# Patient Record
Sex: Female | Born: 1940 | Race: White | Hispanic: No | Smoking: Never smoker
Health system: Southern US, Community
[De-identification: ages and names within clinical notes are randomized; demographics above are authoritative.]

## PROBLEM LIST (undated history)

## (undated) DIAGNOSIS — E7800 Pure hypercholesterolemia, unspecified: Secondary | ICD-10-CM

## (undated) DIAGNOSIS — E2839 Other primary ovarian failure: Secondary | ICD-10-CM

## (undated) DIAGNOSIS — M8580 Other specified disorders of bone density and structure, unspecified site: Secondary | ICD-10-CM

## (undated) DIAGNOSIS — E079 Disorder of thyroid, unspecified: Secondary | ICD-10-CM

## (undated) DIAGNOSIS — I10 Essential (primary) hypertension: Secondary | ICD-10-CM

## (undated) DIAGNOSIS — A4901 Methicillin susceptible Staphylococcus aureus infection, unspecified site: Secondary | ICD-10-CM

---

## 1999-04-07 ENCOUNTER — Encounter: Payer: Self-pay | Admitting: Orthopedic Surgery

## 1999-04-12 ENCOUNTER — Encounter: Payer: Self-pay | Admitting: Orthopedic Surgery

## 1999-04-12 ENCOUNTER — Inpatient Hospital Stay (HOSPITAL_COMMUNITY): Admission: RE | Admit: 1999-04-12 | Discharge: 1999-04-18 | Payer: Self-pay | Admitting: Orthopedic Surgery

## 1999-06-28 ENCOUNTER — Inpatient Hospital Stay (HOSPITAL_COMMUNITY): Admission: RE | Admit: 1999-06-28 | Discharge: 1999-07-05 | Payer: Self-pay | Admitting: Orthopedic Surgery

## 1999-06-28 ENCOUNTER — Encounter: Payer: Self-pay | Admitting: Orthopedic Surgery

## 1999-07-08 ENCOUNTER — Inpatient Hospital Stay (HOSPITAL_COMMUNITY): Admission: EM | Admit: 1999-07-08 | Discharge: 1999-07-16 | Payer: Self-pay | Admitting: Emergency Medicine

## 1999-07-12 ENCOUNTER — Encounter: Payer: Self-pay | Admitting: Orthopedic Surgery

## 1999-07-16 ENCOUNTER — Inpatient Hospital Stay
Admission: RE | Admit: 1999-07-16 | Discharge: 1999-07-28 | Payer: Self-pay | Admitting: Physical Medicine & Rehabilitation

## 2000-12-11 ENCOUNTER — Other Ambulatory Visit: Admission: RE | Admit: 2000-12-11 | Discharge: 2000-12-11 | Payer: Self-pay | Admitting: Family Medicine

## 2001-05-22 ENCOUNTER — Encounter: Payer: Self-pay | Admitting: Orthopedic Surgery

## 2001-05-23 ENCOUNTER — Inpatient Hospital Stay (HOSPITAL_COMMUNITY): Admission: RE | Admit: 2001-05-23 | Discharge: 2001-05-28 | Payer: Self-pay | Admitting: Orthopedic Surgery

## 2001-05-23 ENCOUNTER — Encounter: Payer: Self-pay | Admitting: Orthopedic Surgery

## 2001-05-28 ENCOUNTER — Inpatient Hospital Stay (HOSPITAL_COMMUNITY)
Admission: AD | Admit: 2001-05-28 | Discharge: 2001-06-01 | Payer: Self-pay | Admitting: Physical Medicine & Rehabilitation

## 2002-08-28 ENCOUNTER — Encounter: Payer: Self-pay | Admitting: Orthopedic Surgery

## 2002-09-04 ENCOUNTER — Inpatient Hospital Stay (HOSPITAL_COMMUNITY): Admission: RE | Admit: 2002-09-04 | Discharge: 2002-09-08 | Payer: Self-pay | Admitting: Orthopedic Surgery

## 2002-09-04 ENCOUNTER — Encounter: Payer: Self-pay | Admitting: Orthopedic Surgery

## 2011-03-15 ENCOUNTER — Encounter (HOSPITAL_COMMUNITY): Payer: Self-pay | Admitting: Pharmacy Technician

## 2011-03-16 ENCOUNTER — Other Ambulatory Visit: Payer: Self-pay | Admitting: Orthopedic Surgery

## 2011-03-16 ENCOUNTER — Ambulatory Visit (HOSPITAL_COMMUNITY): Admission: RE | Admit: 2011-03-16 | Payer: Medicare Other | Source: Ambulatory Visit | Admitting: Orthopedic Surgery

## 2011-03-16 ENCOUNTER — Ambulatory Visit
Admission: RE | Admit: 2011-03-16 | Discharge: 2011-03-16 | Disposition: A | Discharge status: Home / Self Care | Payer: Medicare Other | Source: Ambulatory Visit | Attending: Orthopedic Surgery | Admitting: Orthopedic Surgery

## 2011-03-16 ENCOUNTER — Encounter (HOSPITAL_COMMUNITY): Admission: RE | Payer: Self-pay | Source: Ambulatory Visit

## 2011-03-16 DIAGNOSIS — Z96649 Presence of unspecified artificial hip joint: Secondary | ICD-10-CM

## 2011-03-16 LAB — SYNOVIAL CELL COUNT + DIFF, W/ CRYSTALS
Lymphocytes-Synovial Fld: 5 % (ref 0–20)
Neutrophil, Synovial: 95 % — ABNORMAL HIGH (ref 0–25)

## 2011-03-16 SURGERY — IRRIGATION AND DEBRIDEMENT EXTREMITY
Anesthesia: General | Laterality: Right

## 2011-03-17 LAB — C REACTIVE PROTEIN, FLUID: C Reactive Protein, Fluid: 6.4 mg/dL

## 2011-03-19 LAB — BODY FLUID CULTURE: Organism ID, Bacteria: NO GROWTH

## 2016-12-13 ENCOUNTER — Encounter (HOSPITAL_COMMUNITY): Payer: Self-pay | Admitting: Emergency Medicine

## 2016-12-13 ENCOUNTER — Ambulatory Visit (HOSPITAL_COMMUNITY)
Admission: EM | Admit: 2016-12-13 | Discharge: 2016-12-13 | Disposition: A | Discharge status: Home / Self Care | Payer: Medicare Other | Attending: Family Medicine | Admitting: Family Medicine

## 2016-12-13 ENCOUNTER — Other Ambulatory Visit: Payer: Self-pay

## 2016-12-13 DIAGNOSIS — K5900 Constipation, unspecified: Secondary | ICD-10-CM

## 2016-12-13 HISTORY — DX: Disorder of thyroid, unspecified: E07.9

## 2016-12-13 HISTORY — DX: Methicillin susceptible Staphylococcus aureus infection, unspecified site: A49.01

## 2016-12-13 HISTORY — DX: Essential (primary) hypertension: I10

## 2016-12-13 HISTORY — DX: Pure hypercholesterolemia, unspecified: E78.00

## 2016-12-13 HISTORY — DX: Other primary ovarian failure: E28.39

## 2016-12-13 HISTORY — DX: Other specified disorders of bone density and structure, unspecified site: M85.80

## 2016-12-13 MED ORDER — POLYETHYLENE GLYCOL 3350 17 GM/SCOOP PO POWD: 1.0000 | ORAL | 0 refills | Status: AC | PRN

## 2016-12-13 NOTE — ED Provider Notes (Signed)
[facility]   [id number] 12/13/16 Arrival Time: 1103   SUBJECTIVE:  Erica Suarez is a 76 y.o. female who presents to the urgent care with complaint of constipation x2 days with abdominal tenderness and soreness.  She thinks it is from her medicine changes that started two weeks ago.    Pt states she was instructed to start taking her Synthroid by itself, first thing in the morning on an empty stomach.  She started having issues with constipation two nights ago and the pain in her abdomen has only gotten worse.  Pt did call her PCP to discuss her issues.  She is taking Colace as instructed.  Last BM was 4 days ago.     Past Medical History:  Diagnosis Date  . Estrogen deficiency   . Hypercholesterolemia   . Hypertension   . Methicillin susceptible Staphylococcus aureus infection, unspecified site   . Osteopenia   . Thyroid disease    History reviewed. No pertinent family history. Social History   Socioeconomic History  . Marital status: Divorced    Spouse name: Not on file  . Number of children: Not on file  . Years of education: Not on file  . Highest education level: Not on file  Social Needs  . Financial resource strain: Not on file  . Food insecurity - worry: Not on file  . Food insecurity - inability: Not on file  . Transportation needs - medical: Not on file  . Transportation needs - non-medical: Not on file  Occupational History  . Not on file  Tobacco Use  . Smoking status: Never Smoker  . Smokeless tobacco: Never Used  Substance and Sexual Activity  . Alcohol use: No    Frequency: Never  . Drug use: No  . Sexual activity: Not on file  Other Topics Concern  . Not on file  Social History Narrative  . Not on file   Current Meds  Medication Sig  . aspirin EC 81 MG tablet Take 81 mg by mouth daily.  . calcium carbonate (OS-CAL) 600 MG TABS Take 1,200 mg by mouth daily.  . cephALEXin (KEFLEX) 250 MG capsule Take 750 mg by mouth 2 (two)  times daily.  Marland Kitchen. levothyroxine (SYNTHROID, LEVOTHROID) 50 MCG tablet Take 50 mcg by mouth daily.  . Multiple Vitamin (MULITIVITAMIN WITH MINERALS) TABS Take 1 tablet by mouth daily.  . Omega-3 Fatty Acids (FISH OIL) 1200 MG CAPS Take 2,400 mg by mouth daily.  . pravastatin (PRAVACHOL) 40 MG tablet Take 40 mg by mouth daily.  . raloxifene (EVISTA) 60 MG tablet Take 60 mg by mouth daily.  . valsartan-hydrochlorothiazide (DIOVAN-HCT) 160-25 MG per tablet Take 1 tablet by mouth daily.  . vitamin E 400 UNIT capsule Take 800 Units by mouth daily.   Allergies  Allergen Reactions  . Morphine And Related       ROS: As per HPI, remainder of ROS negative.   OBJECTIVE:   Vitals:   12/13/16 1124  BP: 105/68  Pulse: 89  Temp: 97.6 F (36.4 C)  TempSrc: Oral  SpO2: 95%     General appearance: alert; no distress Eyes: PERRL; EOMI; conjunctiva normal HENT: normocephalic; atraumatic; external ears normal without trauma; nasal mucosa normal; oral mucosa normal Neck: supple Abdomen: soft, distended and mild diffuse tenderness; bowel sounds normal; no masses or organomegaly; no guarding or rebound tenderness Back: no CVA tenderness Extremities: no cyanosis or edema; symmetrical with no gross deformities Skin: warm and dry Neurologic: normal gait;  grossly normal Psychological: alert and cooperative; normal mood and affect      Labs:  Results for orders placed or performed in visit on 03/16/11  Body fluid culture  Result Value Ref Range   Gram Stain Moderate    Gram Stain WBC present-predominately PMN    Gram Stain No Organisms Seen    Gram Stain Gram Stain Report Called to,Read Back By     Gram Stain and Verified With:    Gram Stain DR. LANDAU 03/16/11 15:05 BY GARRS    Organism ID, Bacteria NO GROWTH 3 DAYS     Labs Reviewed - No data to display  No results found.     ASSESSMENT & PLAN:  1. Constipation, unspecified constipation type     Meds ordered this encounter    Medications  . polyethylene glycol powder (MIRALAX) powder    Sig: Take 255 g by mouth every hour as needed.    Dispense:  255 g    Refill:  0    Reviewed expectations re: course of current medical issues. Questions answered. Outlined signs and symptoms indicating need for more acute intervention. Patient verbalized understanding. After Visit Summary given.    Procedures:      Elvina SidleLauenstein, Chayna Surratt, MD 12/13/16 1139

## 2016-12-13 NOTE — ED Triage Notes (Signed)
Pt reports constipation x2 days with abdominal tenderness and soreness.  She thinks it is from her medicine changes that started two weeks ago.  Pt states she was instructed to start taking her Synthroid by itself, first thing in the morning on an empty stomach.  She started having issues with constipation two nights ago and the pain in her abdomen has only gotten worse.  Pt did not call her PCP to discuss her issues.

## 2016-12-13 NOTE — Discharge Instructions (Signed)
Use glycerin suppository daily  Take the Miralax powder, 1 capful in 8 oz water, every hour until you have a BM.  Then take one glassful daily for a week.  Prune juice and other fruits help with regularity, along with plenty of liquids.

## 2016-12-14 ENCOUNTER — Emergency Department (HOSPITAL_COMMUNITY): Payer: Medicare Other

## 2016-12-14 ENCOUNTER — Inpatient Hospital Stay (HOSPITAL_COMMUNITY)
Admission: EM | Admit: 2016-12-14 | Discharge: 2017-01-10 | DRG: 329 | Disposition: E | Payer: Medicare Other | Attending: Pulmonary Disease | Admitting: Pulmonary Disease

## 2016-12-14 ENCOUNTER — Encounter (HOSPITAL_COMMUNITY): Admission: EM | Disposition: E | Payer: Self-pay | Source: Home / Self Care | Attending: Pulmonary Disease

## 2016-12-14 ENCOUNTER — Other Ambulatory Visit: Payer: Self-pay

## 2016-12-14 ENCOUNTER — Encounter (HOSPITAL_COMMUNITY): Payer: Self-pay | Admitting: Emergency Medicine

## 2016-12-14 DIAGNOSIS — I469 Cardiac arrest, cause unspecified: Secondary | ICD-10-CM | POA: Diagnosis not present

## 2016-12-14 DIAGNOSIS — E871 Hypo-osmolality and hyponatremia: Secondary | ICD-10-CM | POA: Diagnosis present

## 2016-12-14 DIAGNOSIS — G9340 Encephalopathy, unspecified: Secondary | ICD-10-CM | POA: Diagnosis not present

## 2016-12-14 DIAGNOSIS — E860 Dehydration: Secondary | ICD-10-CM | POA: Diagnosis present

## 2016-12-14 DIAGNOSIS — E874 Mixed disorder of acid-base balance: Secondary | ICD-10-CM | POA: Diagnosis not present

## 2016-12-14 DIAGNOSIS — M8580 Other specified disorders of bone density and structure, unspecified site: Secondary | ICD-10-CM | POA: Diagnosis present

## 2016-12-14 DIAGNOSIS — Z8051 Family history of malignant neoplasm of kidney: Secondary | ICD-10-CM

## 2016-12-14 DIAGNOSIS — I708 Atherosclerosis of other arteries: Secondary | ICD-10-CM | POA: Diagnosis present

## 2016-12-14 DIAGNOSIS — Z66 Do not resuscitate: Secondary | ICD-10-CM | POA: Diagnosis not present

## 2016-12-14 DIAGNOSIS — Z91048 Other nonmedicinal substance allergy status: Secondary | ICD-10-CM

## 2016-12-14 DIAGNOSIS — G9341 Metabolic encephalopathy: Secondary | ICD-10-CM | POA: Diagnosis not present

## 2016-12-14 DIAGNOSIS — D649 Anemia, unspecified: Secondary | ICD-10-CM | POA: Diagnosis present

## 2016-12-14 DIAGNOSIS — N19 Unspecified kidney failure: Secondary | ICD-10-CM

## 2016-12-14 DIAGNOSIS — Z808 Family history of malignant neoplasm of other organs or systems: Secondary | ICD-10-CM

## 2016-12-14 DIAGNOSIS — N179 Acute kidney failure, unspecified: Secondary | ICD-10-CM | POA: Diagnosis present

## 2016-12-14 DIAGNOSIS — Z885 Allergy status to narcotic agent status: Secondary | ICD-10-CM

## 2016-12-14 DIAGNOSIS — J80 Acute respiratory distress syndrome: Secondary | ICD-10-CM | POA: Diagnosis not present

## 2016-12-14 DIAGNOSIS — R109 Unspecified abdominal pain: Secondary | ICD-10-CM | POA: Diagnosis present

## 2016-12-14 DIAGNOSIS — Z7982 Long term (current) use of aspirin: Secondary | ICD-10-CM

## 2016-12-14 DIAGNOSIS — K449 Diaphragmatic hernia without obstruction or gangrene: Secondary | ICD-10-CM | POA: Diagnosis present

## 2016-12-14 DIAGNOSIS — A419 Sepsis, unspecified organism: Secondary | ICD-10-CM | POA: Diagnosis not present

## 2016-12-14 DIAGNOSIS — I2510 Atherosclerotic heart disease of native coronary artery without angina pectoris: Secondary | ICD-10-CM | POA: Diagnosis present

## 2016-12-14 DIAGNOSIS — R579 Shock, unspecified: Secondary | ICD-10-CM | POA: Diagnosis not present

## 2016-12-14 DIAGNOSIS — I10 Essential (primary) hypertension: Secondary | ICD-10-CM | POA: Diagnosis present

## 2016-12-14 DIAGNOSIS — E876 Hypokalemia: Secondary | ICD-10-CM | POA: Diagnosis not present

## 2016-12-14 DIAGNOSIS — K562 Volvulus: Secondary | ICD-10-CM | POA: Diagnosis present

## 2016-12-14 DIAGNOSIS — K658 Other peritonitis: Secondary | ICD-10-CM | POA: Diagnosis not present

## 2016-12-14 DIAGNOSIS — Z452 Encounter for adjustment and management of vascular access device: Secondary | ICD-10-CM

## 2016-12-14 DIAGNOSIS — Z79890 Hormone replacement therapy (postmenopausal): Secondary | ICD-10-CM | POA: Diagnosis not present

## 2016-12-14 DIAGNOSIS — Z800 Family history of malignant neoplasm of digestive organs: Secondary | ICD-10-CM

## 2016-12-14 DIAGNOSIS — Z515 Encounter for palliative care: Secondary | ICD-10-CM | POA: Diagnosis not present

## 2016-12-14 DIAGNOSIS — E785 Hyperlipidemia, unspecified: Secondary | ICD-10-CM | POA: Diagnosis present

## 2016-12-14 DIAGNOSIS — E039 Hypothyroidism, unspecified: Secondary | ICD-10-CM | POA: Diagnosis present

## 2016-12-14 DIAGNOSIS — N289 Disorder of kidney and ureter, unspecified: Secondary | ICD-10-CM

## 2016-12-14 DIAGNOSIS — J9601 Acute respiratory failure with hypoxia: Secondary | ICD-10-CM

## 2016-12-14 DIAGNOSIS — K631 Perforation of intestine (nontraumatic): Secondary | ICD-10-CM | POA: Diagnosis not present

## 2016-12-14 DIAGNOSIS — T17908A Unspecified foreign body in respiratory tract, part unspecified causing other injury, initial encounter: Secondary | ICD-10-CM

## 2016-12-14 DIAGNOSIS — D72829 Elevated white blood cell count, unspecified: Secondary | ICD-10-CM | POA: Diagnosis not present

## 2016-12-14 DIAGNOSIS — Z9289 Personal history of other medical treatment: Secondary | ICD-10-CM

## 2016-12-14 DIAGNOSIS — E7800 Pure hypercholesterolemia, unspecified: Secondary | ICD-10-CM | POA: Diagnosis present

## 2016-12-14 DIAGNOSIS — R6521 Severe sepsis with septic shock: Secondary | ICD-10-CM | POA: Diagnosis not present

## 2016-12-14 HISTORY — PX: FLEXIBLE SIGMOIDOSCOPY: SHX5431

## 2016-12-14 LAB — LIPASE, BLOOD: LIPASE: 39 U/L (ref 11–51)

## 2016-12-14 LAB — COMPREHENSIVE METABOLIC PANEL
ALT: 30 U/L (ref 14–54)
AST: 61 U/L — ABNORMAL HIGH (ref 15–41)
Albumin: 3.4 g/dL — ABNORMAL LOW (ref 3.5–5.0)
Alkaline Phosphatase: 106 U/L (ref 38–126)
Anion gap: 19 — ABNORMAL HIGH (ref 5–15)
BUN: 56 mg/dL — ABNORMAL HIGH (ref 6–20)
CHLORIDE: 88 mmol/L — AB (ref 101–111)
CO2: 20 mmol/L — ABNORMAL LOW (ref 22–32)
CREATININE: 2.34 mg/dL — AB (ref 0.44–1.00)
Calcium: 11.2 mg/dL — ABNORMAL HIGH (ref 8.9–10.3)
GFR, EST AFRICAN AMERICAN: 22 mL/min — AB (ref >60)
GFR, EST NON AFRICAN AMERICAN: 19 mL/min — AB (ref >60)
Glucose, Bld: 139 mg/dL — ABNORMAL HIGH (ref 65–99)
POTASSIUM: 2.4 mmol/L — AB (ref 3.5–5.1)
Sodium: 127 mmol/L — ABNORMAL LOW (ref 135–145)
Total Bilirubin: 0.7 mg/dL (ref 0.3–1.2)
Total Protein: 7.4 g/dL (ref 6.5–8.1)

## 2016-12-14 LAB — I-STAT CG4 LACTIC ACID, ED: Lactic Acid, Venous: 2.16 mmol/L (ref 0.5–1.9)

## 2016-12-14 LAB — CBC
HEMATOCRIT: 36.1 % (ref 36.0–46.0)
Hemoglobin: 12 g/dL (ref 12.0–15.0)
MCH: 24.3 pg — ABNORMAL LOW (ref 26.0–34.0)
MCHC: 33.2 g/dL (ref 30.0–36.0)
MCV: 73.2 fL — AB (ref 78.0–100.0)
PLATELETS: 403 10*3/uL — AB (ref 150–400)
RBC: 4.93 MIL/uL (ref 3.87–5.11)
RDW: 16.6 % — ABNORMAL HIGH (ref 11.5–15.5)
WBC: 16.8 10*3/uL — AB (ref 4.0–10.5)

## 2016-12-14 LAB — POC OCCULT BLOOD, ED: FECAL OCCULT BLD: NEGATIVE

## 2016-12-14 LAB — CREATININE, SERUM
Creatinine, Ser: 2.48 mg/dL — ABNORMAL HIGH (ref 0.44–1.00)
GFR calc non Af Amer: 18 mL/min — ABNORMAL LOW (ref >60)
GFR, EST AFRICAN AMERICAN: 20 mL/min — AB (ref >60)

## 2016-12-14 LAB — TSH: TSH: 1.937 u[IU]/mL (ref 0.350–4.500)

## 2016-12-14 LAB — OSMOLALITY: OSMOLALITY: 286 mosm/kg (ref 275–295)

## 2016-12-14 SURGERY — SIGMOIDOSCOPY, FLEXIBLE
Anesthesia: Moderate Sedation

## 2016-12-14 MED ORDER — ACETAMINOPHEN 325 MG PO TABS: 650.0000 mg | ORAL_TABLET | Freq: Four times a day (QID) | ORAL | Status: DC | PRN

## 2016-12-14 MED ORDER — ONDANSETRON HCL 4 MG/2ML IJ SOLN
4.0000 mg | Freq: Once | INTRAMUSCULAR | Status: AC
  Administered 2016-12-14: 4 mg via INTRAVENOUS
  Filled 2016-12-14: qty 2

## 2016-12-14 MED ORDER — POTASSIUM CHLORIDE CRYS ER 20 MEQ PO TBCR
40.0000 meq | EXTENDED_RELEASE_TABLET | Freq: Once | ORAL | Status: DC
  Filled 2016-12-14: qty 2

## 2016-12-14 MED ORDER — RALOXIFENE HCL 60 MG PO TABS
60.0000 mg | ORAL_TABLET | Freq: Every day | ORAL | Status: DC
  Administered 2016-12-15: 60 mg via ORAL
  Filled 2016-12-14 (×2): qty 1

## 2016-12-14 MED ORDER — SODIUM CHLORIDE 0.9 % IV BOLUS (SEPSIS)
1000.0000 mL | Freq: Once | INTRAVENOUS | Status: AC
  Administered 2016-12-14: 1000 mL via INTRAVENOUS

## 2016-12-14 MED ORDER — ASPIRIN EC 81 MG PO TBEC
81.0000 mg | DELAYED_RELEASE_TABLET | Freq: Every day | ORAL | Status: DC
  Administered 2016-12-15: 81 mg via ORAL
  Filled 2016-12-14: qty 1

## 2016-12-14 MED ORDER — CEPHALEXIN 500 MG PO CAPS
750.0000 mg | ORAL_CAPSULE | Freq: Two times a day (BID) | ORAL | Status: DC
  Administered 2016-12-14 – 2016-12-15 (×2): 750 mg via ORAL
  Filled 2016-12-14 (×2): qty 1

## 2016-12-14 MED ORDER — LEVOTHYROXINE SODIUM 50 MCG PO TABS
50.0000 ug | ORAL_TABLET | Freq: Every day | ORAL | Status: DC
  Administered 2016-12-15: 50 ug via ORAL
  Filled 2016-12-14: qty 1

## 2016-12-14 MED ORDER — FENTANYL CITRATE (PF) 100 MCG/2ML IJ SOLN
INTRAMUSCULAR | Status: AC
  Filled 2016-12-14: qty 2

## 2016-12-14 MED ORDER — HEPARIN SODIUM (PORCINE) 5000 UNIT/ML IJ SOLN
5000.0000 [IU] | Freq: Three times a day (TID) | INTRAMUSCULAR | Status: DC
  Administered 2016-12-14 – 2016-12-16 (×4): 5000 [IU] via SUBCUTANEOUS
  Filled 2016-12-14 (×5): qty 1

## 2016-12-14 MED ORDER — DIPHENHYDRAMINE HCL 50 MG/ML IJ SOLN
INTRAMUSCULAR | Status: AC
  Filled 2016-12-14: qty 1

## 2016-12-14 MED ORDER — PRAVASTATIN SODIUM 40 MG PO TABS
40.0000 mg | ORAL_TABLET | Freq: Every day | ORAL | Status: DC
  Administered 2016-12-14: 40 mg via ORAL
  Filled 2016-12-14: qty 1

## 2016-12-14 MED ORDER — FENTANYL CITRATE (PF) 100 MCG/2ML IJ SOLN
50.0000 ug | Freq: Once | INTRAMUSCULAR | Status: AC
  Administered 2016-12-14: 50 ug via INTRAVENOUS
  Filled 2016-12-14: qty 2

## 2016-12-14 MED ORDER — MIDAZOLAM HCL 5 MG/ML IJ SOLN
INTRAMUSCULAR | Status: AC
  Filled 2016-12-14: qty 2

## 2016-12-14 MED ORDER — MIDAZOLAM HCL 10 MG/2ML IJ SOLN
INTRAMUSCULAR | Status: DC | PRN
  Administered 2016-12-14: 1 mg via INTRAVENOUS

## 2016-12-14 MED ORDER — IOPAMIDOL (ISOVUE-300) INJECTION 61%
INTRAVENOUS | Status: AC
  Filled 2016-12-14: qty 30

## 2016-12-14 MED ORDER — POTASSIUM CHLORIDE 10 MEQ/100ML IV SOLN
10.0000 meq | Freq: Once | INTRAVENOUS | Status: AC
  Administered 2016-12-14: 10 meq via INTRAVENOUS
  Filled 2016-12-14: qty 100

## 2016-12-14 MED ORDER — POTASSIUM CHLORIDE IN NACL 20-0.9 MEQ/L-% IV SOLN
INTRAVENOUS | Status: DC
  Administered 2016-12-14 – 2016-12-15 (×2): via INTRAVENOUS
  Filled 2016-12-14 (×2): qty 1000

## 2016-12-14 MED ORDER — POTASSIUM CHLORIDE CRYS ER 20 MEQ PO TBCR
40.0000 meq | EXTENDED_RELEASE_TABLET | Freq: Once | ORAL | Status: AC
  Administered 2016-12-14: 40 meq via ORAL
  Filled 2016-12-14: qty 2

## 2016-12-14 MED ORDER — ACETAMINOPHEN 650 MG RE SUPP: 650.0000 mg | Freq: Four times a day (QID) | RECTAL | Status: DC | PRN

## 2016-12-14 MED ORDER — SODIUM CHLORIDE 0.9 % IV SOLN
INTRAVENOUS | Status: DC
  Administered 2016-12-15 (×2): via INTRAVENOUS

## 2016-12-14 NOTE — Progress Notes (Addendum)
12/5 2200 Patient seen and examined following flex sig, decompression. Found dusky mucosa in sigmoid - evidence of mucosal ischemia. No evidence of perforation on exam, CT or during endoscopy and since procedure, she states she's feeling much better - distention gone and denies any abdominal pain.  AF, VS Normal Abdomen is soft, significant improvement in distention; nontender; no rebound/guarding.  Plan -NPO, IVF -Continue to monitor closely  Stephanie Couphristopher M. Cliffton AstersWhite, M.D. [facility]    12/6 0300 Seen/examined for serial abd exam - continues to feel well. Denies abdominal pain. Denies n/v. Passing a lot of flatus.  AF VS normal Abd soft, nontender; no r/g  Stephanie Couphristopher M. Cliffton AstersWhite, M.D. General and Colorectal Surgery [facility]

## 2016-12-14 NOTE — ED Notes (Signed)
Patient unable to finish second bottle of contrast - MD states okay and patient may go to CT now - patient transported to CT at this time.

## 2016-12-14 NOTE — ED Notes (Signed)
[facility] 

## 2016-12-14 NOTE — ED Provider Notes (Signed)
[facility] EMERGENCY DEPARTMENT Provider Note   CSN: [id number] Arrival date & time: 12/15/2016  1238     History   Chief Complaint Chief Complaint  Patient presents with  . Abdominal Pain    HPI Erica Suarez is a 76 y.o. female.  HPI   76 year old female with history of hypertension, hypercholesterolemia, thyroid disease presenting for evaluation of abdominal pain.  Patient report for the past 4 days she has had gradual onset of waxing waning diffuse abdominal pain.  She describes her pain as an achy sensation, that is 9 out of 10, with associated nausea, dry heaving, and inability to having bowel movement.  She has not had a bowel movement in the past 3 days.  She feels bloated.  She is able to pass flatus.  She has tried using suppository and MiraLAX without relief.  She reports feeling increasingly weak and today her leg nearly gave out.  She did went to urgent care center for her symptoms yesterday and was told to use suppository which she has been doing with minimal improvement.  No report of fever, chills, chest pain, shortness of breath, productive cough, shortness of breath.  Denies back pain or dysuria.  She makes urine.  She lives at home herself.  She has had prior abdominal surgery including cholecystectomy.  Patient has a chronic wound on her right thigh that she denies having any increasing pain.  Past Medical History:  Diagnosis Date  . Estrogen deficiency   . Hypercholesterolemia   . Hypertension   . Methicillin susceptible Staphylococcus aureus infection, unspecified site   . Osteopenia   . Thyroid disease     There are no active problems to display for this patient.   History reviewed. No pertinent surgical history.  OB History    No data available       Home Medications    Prior to Admission medications   Medication Sig Start Date End Date Taking? Authorizing Provider  aspirin EC 81 MG tablet Take 81 mg by mouth daily.     [provider]  calcium carbonate (OS-CAL) 600 MG TABS Take 1,200 mg by mouth daily.    [provider]  cephALEXin (KEFLEX) 250 MG capsule Take 750 mg by mouth 2 (two) times daily.    [provider]  levothyroxine (SYNTHROID, LEVOTHROID) 50 MCG tablet Take 50 mcg by mouth daily.    [provider]  Loratadine (CLARITIN) 10 MG CAPS Take 1 capsule by mouth.    [provider]  Multiple Vitamin (MULITIVITAMIN WITH MINERALS) TABS Take 1 tablet by mouth daily.    [provider]  Omega-3 Fatty Acids (FISH OIL) 1200 MG CAPS Take 2,400 mg by mouth daily.    [provider]  polyethylene glycol powder (MIRALAX) powder Take 255 g by mouth every hour as needed. 12/13/16   Elvina Sidle, MD  pravastatin (PRAVACHOL) 40 MG tablet Take 40 mg by mouth daily.    [provider]  raloxifene (EVISTA) 60 MG tablet Take 60 mg by mouth daily.    [provider]  valsartan-hydrochlorothiazide (DIOVAN-HCT) 160-25 MG per tablet Take 1 tablet by mouth daily.    [provider]  vitamin E 400 UNIT capsule Take 800 Units by mouth daily.    [provider]    Family History No family history on file.  Social History Social History   Tobacco Use  . Smoking status: Never Smoker  . Smokeless tobacco: Never Used  Substance Use Topics  . Alcohol use: No    Frequency: Never  . Drug use: No     Allergies   Morphine and related and Tape   Review of Systems Review of Systems  All other systems reviewed and are negative.    Physical Exam Updated Vital Signs BP 108/74 (BP Location: Left Arm)   Pulse 92   Temp 98.2 F (36.8 C) (Oral)   Resp 16   Ht 5\' 8"  (1.727 m)   Wt 99.8 kg (220 lb)   SpO2 95%   BMI 33.45 kg/m   Physical Exam  Constitutional: She appears well-developed and well-nourished. She appears ill.  Elderly female, ill-appearing  HENT:  Head: Atraumatic.  Eyes: Conjunctivae are normal.    Neck: Neck supple.  Cardiovascular: Normal rate and regular rhythm.  Pulmonary/Chest: Effort normal and breath sounds normal.  Abdominal: She exhibits distension. Bowel sounds are absent. There is tenderness (Diffuse abdominal tenderness without guarding or rebound tenderness.  Large abdominal surgical scar on the right side abdomen without hernia noted.). No hernia.  Genitourinary: Rectal exam shows no mass, no tenderness and guaiac negative stool.  Genitourinary Comments: Chaperone present during exam.  Neurological: She is alert.  Skin: No rash noted.  Psychiatric: She has a normal mood and affect.  Nursing note and vitals reviewed.    ED Treatments / Results  Labs (all labs ordered are listed, but only abnormal results are displayed) Labs Reviewed  COMPREHENSIVE METABOLIC PANEL - Abnormal; Notable for the following components:      Result Value   Sodium 127 (*)    Potassium 2.4 (*)    Chloride 88 (*)    CO2 20 (*)    Glucose, Bld 139 (*)    BUN 56 (*)    Creatinine, Ser 2.34 (*)    Calcium 11.2 (*)    Albumin 3.4 (*)    AST 61 (*)    GFR calc non Af Amer 19 (*)    GFR calc Af Amer 22 (*)    Anion gap 19 (*)    All other components within normal limits  CBC - Abnormal; Notable for the following components:   WBC 16.8 (*)    MCV 73.2 (*)    MCH 24.3 (*)    RDW 16.6 (*)    Platelets 403 (*)    All other components within normal limits  I-STAT CG4 LACTIC ACID, ED - Abnormal; Notable for the following components:   Lactic Acid, Venous 2.16 (*)    All other components within normal limits  LIPASE, BLOOD  URINALYSIS, ROUTINE W REFLEX MICROSCOPIC  POC OCCULT BLOOD, ED    EKG  EKG Interpretation  Date/Time:  Wednesday December 14 2016 13:51:10 EST Ventricular Rate:  94 PR Interval:  188 QRS Duration: 104 QT Interval:  366 QTC Calculation: 457 R Axis:   108 Text Interpretation:  Sinus rhythm with Premature atrial complexes Rightward axis Inferior infarct , age  undetermined Possible Anterior infarct , age undetermined Abnormal ECG poor data quality. Confirmed by Pricilla LovelessGoldston, Scott [phone]([id number]) on 12/27/2016 3:52:09 PM       Radiology Ct Abdomen Pelvis Wo Contrast  Result Date: 12/28/2016 CLINICAL DATA:  Abdominal distention and constipation EXAM: CT ABDOMEN AND PELVIS WITHOUT CONTRAST TECHNIQUE: Multidetector CT imaging of the abdomen and pelvis was performed following the standard protocol without IV contrast. Oral contrast was administered. COMPARISON:  Abdominal radiographs December 14, 2016 FINDINGS: Lower chest: There is atelectatic change in the left base.  Lung bases otherwise are clear. There are foci of coronary artery calcification. There is a focal hiatal hernia which contains contrast. Hepatobiliary: No focal liver lesions are evident. Gallbladder is not appreciable on this study. There is no biliary duct dilatation. Pancreas: There is no pancreatic mass or inflammatory focus. Spleen: No splenic lesions are evident. Adrenals/Urinary Tract: There is adrenal hypertrophy bilaterally. Kidneys bilaterally show no evident mass or hydronephrosis on either side. There is no renal or ureteral calculus on either side. Urinary bladder is not well seen due to susceptibility artifact from total hip replacements. Urinary bladder wall does not appear appreciably thickened. Stomach/Bowel: There is marked distention of multiple loops of colon with evidence of a degree of sigmoid volvulus. The sigmoid is largely in the upper abdomen with marked distention. The twisting the mesenteric E is seen along the more distal aspect of the of sigmoid in the midline region of the pelvis. There is no appreciable small bowel obstruction. No free air or portal venous air is evident. Vascular/Lymphatic: There is atherosclerotic calcification in the aorta and iliac arteries. Major mesenteric vessels appear patent, although there is moderate atherosclerotic calcification in major mesenteric vessels.  No adenopathy is appreciable in the abdomen or pelvis. Reproductive: Several calcified uterine leiomyomas are noted in the pelvis, largest measuring 3.4 x 2.9 cm. No other pelvic masses are evident. Other: No periappendiceal region inflammation noted. No abscess or ascites evident in the abdomen or pelvis. 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 There is degenerative change throughout the lower thoracic and lumbar spine regions. There are no blastic or lytic bone lesions. There is no intramuscular or abdominal wall lesion. IMPRESSION: 1. Marked dilatation of most of the colon with evidence of sigmoid volvulus. Twisted no mesenteric is seen in the midline at the junction of the mid the distal sigmoid. 2.  No small bowel obstruction.  No free air. 3. Hiatal hernia containing contrast, likely due to the distal obstruction from the sigmoid volvulus. 4. Aortoiliac atherosclerosis. No aneurysm evident. Coronary artery calcification also evident. 5. Adrenal hypertrophy bilaterally. Significance of this finding uncertain. 6.  No evident renal or ureteral calculus.  No hydronephrosis. 7.  Several calcified uterine leiomyomas. Aortic Atherosclerosis (ICD10-I70.0). Critical Value/emergent results were called by telephone at the time of interpretation on 01/01/2017 at 5:57 pm to [facility] Ps[facility]OWIE Sairah Knobloch, PA , who verbally acknowledged these results. Electronically Signed   By: Bretta BangWilliam  Woodruff III M.D.   On: 01/06/2017 17:58   Dg Abd Acute W/chest  Result Date: 12/24/2016 CLINICAL DATA:  Generalized abdominal pain and distension associated with nausea and constipation for the past 4 days. No current chest symptoms. EXAM: DG ABDOMEN ACUTE W/ 1V CHEST COMPARISON:  No recent studies in PACs FINDINGS: There are severely distended bowel loops within the abdomen. One loop measures up to 13 cm in diameter. It is not possible to determine whether this is small or large-bowel though I favor large  bowel. No definite free extraluminal gas collections are observed. The lungs are hypoinflated but clear. The heart and pulmonary vascularity are normal. IMPRESSION: Severely distended bowel loops with air and fluid. No definite perforation. Abdominal and pelvic CT scanning is recommended. No acute cardiopulmonary abnormality. I cannot exclude a sigmoid volvulus in the appropriate clinical setting. Electronically Signed   By: David  SwazilandJordan M.D.   On: 12/28/2016 16:31    Procedures Procedures (including critical care time)  Medications Ordered in ED Medications  iopamidol (ISOVUE-300) 61 % injection (not administered)  potassium chloride SA (K-DUR,KLOR-CON) CR tablet 40 mEq (not administered)  fentaNYL (SUBLIMAZE) injection 50 mcg (50 mcg Intravenous Given 12/24/2016 1631)  sodium chloride 0.9 % bolus 1,000 mL (0 mLs Intravenous Stopped 01/08/2017 1851)  potassium chloride 10 mEq in 100 mL IVPB (0 mEq Intravenous Stopped 12/15/2016 1750)  ondansetron (ZOFRAN) injection 4 mg (4 mg Intravenous Given 12/27/2016 1631)  ondansetron (ZOFRAN) injection 4 mg (4 mg Intravenous Given 12/31/2016 1858)  sodium chloride 0.9 % bolus 1,000 mL (1,000 mLs Intravenous New Bag/Given 12/25/2016 1851)     Initial Impression / Assessment and Plan / ED Course  I have reviewed the triage vital signs and the nursing notes.  Pertinent labs & imaging results that were available during my care of the patient were reviewed by me and considered in my medical decision making (see chart for details).    BP 117/74   Pulse 69   Temp 98.2 F (36.8 C) (Oral)   Resp (!) 24   Ht 5\' 8"  (1.727 m)   Wt 99.8 kg (220 lb)   SpO2 94%   BMI 33.45 kg/m    Final Clinical Impressions(s) / ED Diagnoses   Final diagnoses:  Sigmoid volvulus (HCC)  Renal insufficiency  Dehydration  Hypokalemia    ED Discharge Orders    None     3:46 PM Patient presents with abdominal discomfort, like a bowel movement.  Her abdomen is distended, with  absent bowel sounds concerning for bowel obstruction.  She is tremulous and generally weak, without focal weakness.  Labs are remarkable for hyponatremia with a sodium of 127, her potassium is low at 2.4 without evidence of U waves on EKG.  Work up initiated.  Care discussed with Dr. Criss Alvine.   5:59 PM CT of abd/pelvis demonstrating a signmoid volvulus without evidence of free air.  Small bowel appears normal.  Will consult surgery for further care.    6:20 PM Appreciate consultation from General Surgery Dr. Cliffton Asters who recommend GI consult for detorsion and medicine to admit.  He will be available for consultation and will see pt in the ER. He does recommend NG tube for comfort.  Pt is dry heaving without vomit.  I have consulted GI specialist Dr. Evette Cristal who agrees to see pt in the ER.  Will consult medicine for admission.   7:43 PM Appreciate consultation from [facility] Dr. Selena Batten who agrees to see and admit pt for further management.  Pt currently receiving IVF for her dehydration, NG tube to help decompress her bowel and for sxs relief.  Pt receiving potassium supplementation due critical K+ of 2.4.    CRITICAL CARE Performed by: Fayrene Helper Total critical care time: 45 minutes Critical care time was exclusive of separately billable procedures and treating other patients. Critical care was necessary to treat or prevent imminent or life-threatening deterioration. Critical care was time spent personally by me on the following activities: development of treatment plan with patient and/or surrogate as well as nursing, discussions with consultants, evaluation of patient's response to treatment, examination of patient, obtaining history from patient or surrogate, ordering and performing treatments and interventions, ordering and review of laboratory studies, ordering and review of radiographic studies, pulse oximetry and re-evaluation of patient's condition.    Fayrene Helper, PA-C 01/09/2017 1946     Pricilla Loveless, MD 12/22/2016 [phone]

## 2016-12-14 NOTE — ED Notes (Signed)
Patient transported to x-ray. ?

## 2016-12-14 NOTE — H&P (Signed)
CC: Abdominal distention; consult by Domenic Moras PA-C  HPI: Erica Suarez is an 76 y.o. female who is here for abdominal distention that has progressed over last 2d. No BM x4 days. She has had nausea/dry heaving and not reliably keeping things down for the last day. Denies f/c; +abdominal distention but denies significant abdominal pain. She has never had this happen before. Nothing makes this better/worse.  She was seen in urgent care yesterday for constipation and abdominal distention and discharged with bowel regimen.   Denies prior colonoscopy FHx: Great aunt had colon cancer  Past Medical History:  Diagnosis Date  . Estrogen deficiency   . Hypercholesterolemia   . Hypertension   . Methicillin susceptible Staphylococcus aureus infection, unspecified site   . Osteopenia   . Thyroid disease     History reviewed. No pertinent surgical history.  Social:  reports that  has never smoked. she has never used smokeless tobacco. She reports that she does not drink alcohol or use drugs.  Allergies:  Allergies  Allergen Reactions  . Morphine And Related Other (See Comments)    "Makes me wacky"  . Tape Other (See Comments)    PULLS OFF THE SKIN; PLEASE USE AN ALTERNATIVE!!    Medications: I have reviewed the patient's current medications.  Results for orders placed or performed during the hospital encounter of 01/07/2017 (from the past 48 hour(s))  Lipase, blood     Status: None   Collection Time: 12/26/2016 12:54 PM  Result Value Ref Range   Lipase 39 11 - 51 U/L  Comprehensive metabolic panel     Status: Abnormal   Collection Time: 01/07/2017 12:54 PM  Result Value Ref Range   Sodium 127 (L) 135 - 145 mmol/L   Potassium 2.4 (LL) 3.5 - 5.1 mmol/L    Comment: CRITICAL RESULT CALLED TO, READ BACK BY AND VERIFIED WITH: B.MICHAELSON,RN 1402 12/26/2016 CLARK,S    Chloride 88 (L) 101 - 111 mmol/L   CO2 20 (L) 22 - 32 mmol/L   Glucose, Bld 139 (H) 65 - 99 mg/dL   BUN 56 (H) 6 - 20  mg/dL   Creatinine, Ser 2.34 (H) 0.44 - 1.00 mg/dL   Calcium 11.2 (H) 8.9 - 10.3 mg/dL   Total Protein 7.4 6.5 - 8.1 g/dL   Albumin 3.4 (L) 3.5 - 5.0 g/dL   AST 61 (H) 15 - 41 U/L   ALT 30 14 - 54 U/L   Alkaline Phosphatase 106 38 - 126 U/L   Total Bilirubin 0.7 0.3 - 1.2 mg/dL   GFR calc non Af Amer 19 (L) >60 mL/min   GFR calc Af Amer 22 (L) >60 mL/min    Comment: (NOTE) The eGFR has been calculated using the CKD EPI equation. This calculation has not been validated in all clinical situations. eGFR's persistently <60 mL/min signify possible Chronic Kidney Disease.    Anion gap 19 (H) 5 - 15  CBC     Status: Abnormal   Collection Time: 01/02/2017 12:54 PM  Result Value Ref Range   WBC 16.8 (H) 4.0 - 10.5 K/uL   RBC 4.93 3.87 - 5.11 MIL/uL   Hemoglobin 12.0 12.0 - 15.0 g/dL   HCT 36.1 36.0 - 46.0 %   MCV 73.2 (L) 78.0 - 100.0 fL   MCH 24.3 (L) 26.0 - 34.0 pg   MCHC 33.2 30.0 - 36.0 g/dL   RDW 16.6 (H) 11.5 - 15.5 %   Platelets 403 (H) 150 - 400 K/uL  POC occult blood, ED Provider will collect     Status: None   Collection Time: 12/13/2016  4:04 PM  Result Value Ref Range   Fecal Occult Bld NEGATIVE NEGATIVE  I-Stat CG4 Lactic Acid, ED     Status: Abnormal   Collection Time: 12/10/2016  4:41 PM  Result Value Ref Range   Lactic Acid, Venous 2.16 (HH) 0.5 - 1.9 mmol/L   Comment NOTIFIED PHYSICIAN     Ct Abdomen Pelvis Wo Contrast  Result Date: 12/30/2016 CLINICAL DATA:  Abdominal distention and constipation EXAM: CT ABDOMEN AND PELVIS WITHOUT CONTRAST TECHNIQUE: Multidetector CT imaging of the abdomen and pelvis was performed following the standard protocol without IV contrast. Oral contrast was administered. COMPARISON:  Abdominal radiographs December 14, 2016 FINDINGS: Lower chest: There is atelectatic change in the left base. Lung bases otherwise are clear. There are foci of coronary artery calcification. There is a focal hiatal hernia which contains contrast. Hepatobiliary: No  focal liver lesions are evident. Gallbladder is not appreciable on this study. There is no biliary duct dilatation. Pancreas: There is no pancreatic mass or inflammatory focus. Spleen: No splenic lesions are evident. Adrenals/Urinary Tract: There is adrenal hypertrophy bilaterally. Kidneys bilaterally show no evident mass or hydronephrosis on either side. There is no renal or ureteral calculus on either side. Urinary bladder is not well seen due to susceptibility artifact from total hip replacements. Urinary bladder wall does not appear appreciably thickened. Stomach/Bowel: There is marked distention of multiple loops of colon with evidence of a degree of sigmoid volvulus. The sigmoid is largely in the upper abdomen with marked distention. The twisting the mesenteric E is seen along the more distal aspect of the of sigmoid in the midline region of the pelvis. There is no appreciable small bowel obstruction. No free air or portal venous air is evident. Vascular/Lymphatic: There is atherosclerotic calcification in the aorta and iliac arteries. Major mesenteric vessels appear patent, although there is moderate atherosclerotic calcification in major mesenteric vessels. No adenopathy is appreciable in the abdomen or pelvis. Reproductive: Several calcified uterine leiomyomas are noted in the pelvis, largest measuring 3.4 x 2.9 cm. No other pelvic masses are evident. Other: No periappendiceal region inflammation noted. No abscess or ascites evident in the abdomen or pelvis. 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 There is degenerative change throughout the lower thoracic and lumbar spine regions. There are no blastic or lytic bone lesions. There is no intramuscular or abdominal wall lesion. IMPRESSION: 1. Marked dilatation of most of the colon with evidence of sigmoid volvulus. Twisted no mesenteric is seen in the midline at the junction of the mid the distal sigmoid. 2.   No small bowel obstruction.  No free air. 3. Hiatal hernia containing contrast, likely due to the distal obstruction from the sigmoid volvulus. 4. Aortoiliac atherosclerosis. No aneurysm evident. Coronary artery calcification also evident. 5. Adrenal hypertrophy bilaterally. Significance of this finding uncertain. 6.  No evident renal or ureteral calculus.  No hydronephrosis. 7.  Several calcified uterine leiomyomas. Aortic Atherosclerosis (ICD10-I70.0). Critical Value/emergent results were called by telephone at the time of interpretation on 12/12/2016 at 5:57 pm to [facility] , who verbally acknowledged these results. Electronically Signed   By: Lowella Grip III M.D.   On: 12/26/2016 17:58   Dg Abd Acute W/chest  Result Date: 12/30/2016 CLINICAL DATA:  Generalized abdominal pain and distension associated with nausea and constipation for the past 4 days. No current chest symptoms. EXAM: DG  ABDOMEN ACUTE W/ 1V CHEST COMPARISON:  No recent studies in PACs FINDINGS: There are severely distended bowel loops within the abdomen. One loop measures up to 13 cm in diameter. It is not possible to determine whether this is small or large-bowel though I favor large bowel. No definite free extraluminal gas collections are observed. The lungs are hypoinflated but clear. The heart and pulmonary vascularity are normal. IMPRESSION: Severely distended bowel loops with air and fluid. No definite perforation. Abdominal and pelvic CT scanning is recommended. No acute cardiopulmonary abnormality. I cannot exclude a sigmoid volvulus in the appropriate clinical setting. Electronically Signed   By: David  Martinique M.D.   On: 12/13/2016 16:31    ROS - all of the below systems have been reviewed with the patient and positives are indicated with bold text General: chills, fever or night sweats Eyes: blurry vision or double vision ENT: epistaxis or sore throat Allergy/Immunology: itchy/watery eyes or nasal  congestion Hematologic/Lymphatic: bleeding problems, blood clots or swollen lymph nodes Endocrine: temperature intolerance or unexpected weight changes Breast: new or changing breast lumps or nipple discharge Resp: cough, shortness of breath, or wheezing CV: chest pain or dyspnea on exertion GI: as per HPI GU: dysuria, trouble voiding, or hematuria MSK: joint pain or joint stiffness Neuro: TIA or stroke symptoms Derm: pruritus and skin lesion changes Psych: anxiety and depression  PE Blood pressure 117/74, pulse 69, temperature 98.2 F (36.8 C), temperature source Oral, resp. rate (!) 24, height '5\' 8"'$  (1.727 m), weight 99.8 kg (220 lb), SpO2 94 %. Constitutional: NAD; conversant; no deformities Eyes: Moist conjunctiva; no lid lag; anicteric; PERRL Neck: Trachea midline; no thyromegaly Lungs: Normal respiratory effort; no tactile fremitus CV: RRR; no palpable thrills; no pitting edema GI: Abdomen is markedly distended; no tenderness on exam; no rebound/guarding. Unable to assess for hepatosplenomegaly MSK: No clubbing. No cyanosis Psychiatric: Appropriate affect; alert and oriented x3 Lymphatic: No palpable cervical or axillary lymphadenopathy  Results for orders placed or performed during the hospital encounter of 12/22/2016 (from the past 48 hour(s))  Lipase, blood     Status: None   Collection Time: 01/01/2017 12:54 PM  Result Value Ref Range   Lipase 39 11 - 51 U/L  Comprehensive metabolic panel     Status: Abnormal   Collection Time: 01/06/2017 12:54 PM  Result Value Ref Range   Sodium 127 (L) 135 - 145 mmol/L   Potassium 2.4 (LL) 3.5 - 5.1 mmol/L    Comment: CRITICAL RESULT CALLED TO, READ BACK BY AND VERIFIED WITH: B.MICHAELSON,RN 1402 01/07/2017 CLARK,S    Chloride 88 (L) 101 - 111 mmol/L   CO2 20 (L) 22 - 32 mmol/L   Glucose, Bld 139 (H) 65 - 99 mg/dL   BUN 56 (H) 6 - 20 mg/dL   Creatinine, Ser 2.34 (H) 0.44 - 1.00 mg/dL   Calcium 11.2 (H) 8.9 - 10.3 mg/dL   Total Protein  7.4 6.5 - 8.1 g/dL   Albumin 3.4 (L) 3.5 - 5.0 g/dL   AST 61 (H) 15 - 41 U/L   ALT 30 14 - 54 U/L   Alkaline Phosphatase 106 38 - 126 U/L   Total Bilirubin 0.7 0.3 - 1.2 mg/dL   GFR calc non Af Amer 19 (L) >60 mL/min   GFR calc Af Amer 22 (L) >60 mL/min    Comment: (NOTE) The eGFR has been calculated using the CKD EPI equation. This calculation has not been validated in all clinical situations. eGFR's persistently <60 mL/min  signify possible Chronic Kidney Disease.    Anion gap 19 (H) 5 - 15  CBC     Status: Abnormal   Collection Time: 12/12/2016 12:54 PM  Result Value Ref Range   WBC 16.8 (H) 4.0 - 10.5 K/uL   RBC 4.93 3.87 - 5.11 MIL/uL   Hemoglobin 12.0 12.0 - 15.0 g/dL   HCT 36.1 36.0 - 46.0 %   MCV 73.2 (L) 78.0 - 100.0 fL   MCH 24.3 (L) 26.0 - 34.0 pg   MCHC 33.2 30.0 - 36.0 g/dL   RDW 16.6 (H) 11.5 - 15.5 %   Platelets 403 (H) 150 - 400 K/uL  POC occult blood, ED Provider will collect     Status: None   Collection Time: 12/19/2016  4:04 PM  Result Value Ref Range   Fecal Occult Bld NEGATIVE NEGATIVE  I-Stat CG4 Lactic Acid, ED     Status: Abnormal   Collection Time: 12/10/2016  4:41 PM  Result Value Ref Range   Lactic Acid, Venous 2.16 (HH) 0.5 - 1.9 mmol/L   Comment NOTIFIED PHYSICIAN     Ct Abdomen Pelvis Wo Contrast  Result Date: 12/10/2016 CLINICAL DATA:  Abdominal distention and constipation EXAM: CT ABDOMEN AND PELVIS WITHOUT CONTRAST TECHNIQUE: Multidetector CT imaging of the abdomen and pelvis was performed following the standard protocol without IV contrast. Oral contrast was administered. COMPARISON:  Abdominal radiographs December 14, 2016 FINDINGS: Lower chest: There is atelectatic change in the left base. Lung bases otherwise are clear. There are foci of coronary artery calcification. There is a focal hiatal hernia which contains contrast. Hepatobiliary: No focal liver lesions are evident. Gallbladder is not appreciable on this study. There is no biliary duct  dilatation. Pancreas: There is no pancreatic mass or inflammatory focus. Spleen: No splenic lesions are evident. Adrenals/Urinary Tract: There is adrenal hypertrophy bilaterally. Kidneys bilaterally show no evident mass or hydronephrosis on either side. There is no renal or ureteral calculus on either side. Urinary bladder is not well seen due to susceptibility artifact from total hip replacements. Urinary bladder wall does not appear appreciably thickened. Stomach/Bowel: There is marked distention of multiple loops of colon with evidence of a degree of sigmoid volvulus. The sigmoid is largely in the upper abdomen with marked distention. The twisting the mesenteric E is seen along the more distal aspect of the of sigmoid in the midline region of the pelvis. There is no appreciable small bowel obstruction. No free air or portal venous air is evident. Vascular/Lymphatic: There is atherosclerotic calcification in the aorta and iliac arteries. Major mesenteric vessels appear patent, although there is moderate atherosclerotic calcification in major mesenteric vessels. No adenopathy is appreciable in the abdomen or pelvis. Reproductive: Several calcified uterine leiomyomas are noted in the pelvis, largest measuring 3.4 x 2.9 cm. No other pelvic masses are evident. Other: No periappendiceal region inflammation noted. No abscess or ascites evident in the abdomen or pelvis. 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 There is degenerative change throughout the lower thoracic and lumbar spine regions. There are no blastic or lytic bone lesions. There is no intramuscular or abdominal wall lesion. IMPRESSION: 1. Marked dilatation of most of the colon with evidence of sigmoid volvulus. Twisted no mesenteric is seen in the midline at the junction of the mid the distal sigmoid. 2.  No small bowel obstruction.  No free air. 3. Hiatal hernia containing contrast, likely due to the distal  obstruction from the sigmoid volvulus. 4. Aortoiliac atherosclerosis.  No aneurysm evident. Coronary artery calcification also evident. 5. Adrenal hypertrophy bilaterally. Significance of this finding uncertain. 6.  No evident renal or ureteral calculus.  No hydronephrosis. 7.  Several calcified uterine leiomyomas. Aortic Atherosclerosis (ICD10-I70.0). Critical Value/emergent results were called by telephone at the time of interpretation on 12/22/2016 at 5:57 pm to [facility], [place] , who verbally acknowledged these results. Electronically Signed   By: Lowella Grip III M.D.   On: 12/29/2016 17:58   Dg Abd Acute W/chest  Result Date: 01/03/2017 CLINICAL DATA:  Generalized abdominal pain and distension associated with nausea and constipation for the past 4 days. No current chest symptoms. EXAM: DG ABDOMEN ACUTE W/ 1V CHEST COMPARISON:  No recent studies in PACs FINDINGS: There are severely distended bowel loops within the abdomen. One loop measures up to 13 cm in diameter. It is not possible to determine whether this is small or large-bowel though I favor large bowel. No definite free extraluminal gas collections are observed. The lungs are hypoinflated but clear. The heart and pulmonary vascularity are normal. IMPRESSION: Severely distended bowel loops with air and fluid. No definite perforation. Abdominal and pelvic CT scanning is recommended. No acute cardiopulmonary abnormality. I cannot exclude a sigmoid volvulus in the appropriate clinical setting. Electronically Signed   By: David  Martinique M.D.   On: 12/29/2016 16:31    A/P: IBETH FAHMY is an 76 y.o. female with HTN, hypothyroidism here today with sigmoid volvulus  -Admission; Medicine has been consulted for admission -Urgent GI consultation for flex sig/decompression - [facility] has been consulted -NPO, IVF -Will continue to monitor closely; discussed the anatomy/physiology of the GI tract at length with the patient, her brother and neighbor  who helps care for her. We discussed the pathophysiology of sigmoid volvulus and treatment approach. Given that she has no evidence of perforation nor peritoneal signs, proceeding with decompression, rehydration and correction of electrolyte abnormalities and potentially surgery this admission for removing the volvulizing segment of colon.  Sharon Mt. Dema Severin, M.D. [facility]

## 2016-12-14 NOTE — H&P (Signed)
TRH H&P   Patient Demographics:    Chante Mayson, is a 76 y.o. female  MRN: [id number]   DOB - [birth year]  Admit Date - 01/08/2017  Outpatient Primary MD for the patient is Maury Dus, MD  Referring MD/NP/PA:  Domenic Moras  Outpatient Specialists:   Patient coming from: home  Chief Complaint  Patient presents with  . Abdominal Pain      HPI:    Ryann Pauli  is a 76 y.o. female, w hypertension, hyperlipidemia, osteopenia apparently c/o abdominal distention x 2 days.  abd pain x 4 days, generalized,  No bm for the past 4 days.  + n/v,  Pt denies fever, chills, diarrhea, brbpr, black stoo   pt presented to ED for evaluation  In ED,   Xray IMPRESSION: Severely distended bowel loops with air and fluid. No definite perforation. Abdominal and pelvic CT scanning is recommended.  No acute cardiopulmonary abnormality. I cannot exclude a sigmoid volvulus in the appropriate clinical setting.  CT scan IMPRESSION: 1. Marked dilatation of most of the colon with evidence of sigmoid volvulus. Twisted no mesenteric is seen in the midline at the junction of the mid the distal sigmoid.  2.  No small bowel obstruction.  No free air.  3. Hiatal hernia containing contrast, likely due to the distal obstruction from the sigmoid volvulus.  4. Aortoiliac atherosclerosis. No aneurysm evident. Coronary artery calcification also evident.  5. Adrenal hypertrophy bilaterally. Significance of this finding uncertain.  6.  No evident renal or ureteral calculus.  No hydronephrosis.  7.  Several calcified uterine leiomyomas.  Na 127, K 2.4 Hco3 20  Bun 56, Creatinine 2.34 Ast 61, Alt 30, Alk phos 106, T. bil 0.7  Pt will be admitted for sigmoid volvulus, and ARF, and hypokalemia, and hypercalcemia.      Review of systems:    In addition to the HPI above, No  Fever-chills, No Headache, No changes with Vision or hearing, No problems swallowing food or Liquids, No Chest pain, Cough or Shortness of Breath, No Blood in stool or Urine, No dysuria, No new skin rashes or bruises, No new joints pains-aches,  No new weakness, tingling, numbness in any extremity, No recent weight gain or loss, No polyuria, polydypsia or polyphagia, No significant Mental Stressors.  A full 10 point Review of Systems was done, except as stated above, all other Review of Systems were negative.   With Past History of the following :    Past Medical History:  Diagnosis Date  . Estrogen deficiency   . Hypercholesterolemia   . Hypertension   . Methicillin susceptible Staphylococcus aureus infection, unspecified site   . Osteopenia   . Thyroid disease       History reviewed. No pertinent surgical history.    Social History:     Social History   Tobacco Use  . Smoking status: Never Smoker  .  Smokeless tobacco: Never Used  Substance Use Topics  . Alcohol use: No    Frequency: Never     Lives - at home  Mobility - walks by self   Family History :     Family History  Problem Relation Age of Onset  . Melanoma Mother   . Renal cancer Father       Home Medications:   Prior to Admission medications   Medication Sig Start Date End Date Taking? Authorizing Provider  aspirin EC 81 MG tablet Take 81 mg by mouth daily.   Yes [provider]  Calcium Carb-Cholecalciferol (CALCIUM 600+D) 600-800 MG-UNIT TABS Take 1 tablet by mouth 2 (two) times daily.   Yes [provider]  cephALEXin (KEFLEX) 250 MG capsule Take 750 mg by mouth 2 (two) times daily.   Yes [provider]  glycerin adult 2 g suppository Place 1 suppository rectally as needed for constipation.   Yes [provider]  levothyroxine (SYNTHROID, LEVOTHROID) 50 MCG tablet Take 50 mcg by mouth daily.   Yes [provider]  Multiple Vitamins-Minerals  (CENTRUM SILVER 50+WOMEN) TABS Take 1 tablet by mouth daily.   Yes [provider]  Omega-3 Fatty Acids (FISH OIL) 1200 MG CAPS Take 2,400 mg by mouth daily.   Yes [provider]  pravastatin (PRAVACHOL) 40 MG tablet Take 40 mg by mouth at bedtime.    Yes [provider]  raloxifene (EVISTA) 60 MG tablet Take 60 mg by mouth daily.   Yes [provider]  valsartan-hydrochlorothiazide (DIOVAN-HCT) 160-25 MG per tablet Take 1 tablet by mouth daily.   Yes [provider]  vitamin E 400 UNIT capsule Take 400 Units by mouth 2 (two) times daily.    Yes [provider]  Wheat Dextrin (BENEFIBER) POWD Mix 1 tablespoonful into eight ounces of water and drink two times a day as needed for mild constipation   Yes [provider]  calcium carbonate (OS-CAL) 600 MG TABS Take 1,200 mg by mouth daily.    [provider]  polyethylene glycol powder (MIRALAX) powder Take 255 g by mouth every hour as needed. Patient not taking: Reported on 12/13/2016 12/13/16   Robyn Haber, MD     Allergies:     Allergies  Allergen Reactions  . Morphine And Related Other (See Comments)    "Makes me wacky"  . Tape Other (See Comments)    PULLS OFF THE SKIN; PLEASE USE AN ALTERNATIVE!!     Physical Exam:   Vitals  Blood pressure (!) 97/58, pulse 75, temperature 97.7 F (36.5 C), temperature source Oral, resp. rate 18, height _0  (1.727 m), weight 97.1 kg (214 lb 1.1 oz), SpO2 97 %.   1. General  lying in bed in NAD,   2. Normal affect and insight, Not Suicidal or Homicidal, Awake Alert, Oriented X 3.  3. No F.N deficits, ALL C.Nerves Intact, Strength 5/5 all 4 extremities, Sensation intact all 4 extremities, Plantars down going.  4. Ears and Eyes appear Normal, Conjunctivae clear, PERRLA. Moist Oral Mucosa.  5. Supple Neck, No JVD, No cervical lymphadenopathy appriciated, No Carotid Bruits.  6. Symmetrical Chest wall movement, Good air  movement bilaterally, CTAB.  7. RRR, No Gallops, Rubs or Murmurs, No Parasternal Heave.  8. Slight abdominal distention., Positive Bowel Sounds, Abdomen Soft, No tenderness, No organomegaly appriciated,No rebound -guarding or rigidity.  9.  No Cyanosis, Normal Skin Turgor, No Skin Rash or Bruise.  10. Good muscle tone,  joints appear normal , no effusions, Normal ROM.  11. No Palpable Lymph Nodes in Neck or Axillae    Data Review:    CBC Recent Labs  Lab 01/04/2017 1254  WBC 16.8*  HGB 12.0  HCT 36.1  PLT 403*  MCV 73.2*  MCH 24.3*  MCHC 33.2  RDW 16.6*   ------------------------------------------------------------------------------------------------------------------  Chemistries  Recent Labs  Lab 01/09/2017 1254  NA 127*  K 2.4*  CL 88*  CO2 20*  GLUCOSE 139*  BUN 56*  CREATININE 2.34*  CALCIUM 11.2*  AST 61*  ALT 30  ALKPHOS 106  BILITOT 0.7   ------------------------------------------------------------------------------------------------------------------ estimated creatinine clearance is 24.5 mL/min (A) (by C-G formula based on SCr of 2.34 mg/dL (H)). ------------------------------------------------------------------------------------------------------------------ No results for input(s): TSH, T4TOTAL, T3FREE, THYROIDAB in the last 72 hours.  Invalid input(s): FREET3  Coagulation profile No results for input(s): INR, PROTIME in the last 168 hours. ------------------------------------------------------------------------------------------------------------------- No results for input(s): DDIMER in the last 72 hours. -------------------------------------------------------------------------------------------------------------------  Cardiac Enzymes No results for input(s): CKMB, TROPONINI, MYOGLOBIN in the last 168 hours.  Invalid input(s):  CK ------------------------------------------------------------------------------------------------------------------ No results found for: BNP   ---------------------------------------------------------------------------------------------------------------  Urinalysis No results found for: COLORURINE, APPEARANCEUR, LABSPEC, PHURINE, GLUCOSEU, HGBUR, BILIRUBINUR, KETONESUR, PROTEINUR, UROBILINOGEN, NITRITE, LEUKOCYTESUR  ----------------------------------------------------------------------------------------------------------------   Imaging Results:    Ct Abdomen Pelvis Wo Contrast  Result Date: 12/24/2016 CLINICAL DATA:  Abdominal distention and constipation EXAM: CT ABDOMEN AND PELVIS WITHOUT CONTRAST TECHNIQUE: Multidetector CT imaging of the abdomen and pelvis was performed following the standard protocol without IV contrast. Oral contrast was administered. COMPARISON:  Abdominal radiographs December 14, 2016 FINDINGS: Lower chest: There is atelectatic change in the left base. Lung bases otherwise are clear. There are foci of coronary artery calcification. There is a focal hiatal hernia which contains contrast. Hepatobiliary: No focal liver lesions are evident. Gallbladder is not appreciable on this study. There is no biliary duct dilatation. Pancreas: There is no pancreatic mass or inflammatory focus. Spleen: No splenic lesions are evident. Adrenals/Urinary Tract: There is adrenal hypertrophy bilaterally. Kidneys bilaterally show no evident mass or hydronephrosis on either side. There is no renal or ureteral calculus on either side. Urinary bladder is not well seen due to susceptibility artifact from total hip replacements. Urinary bladder wall does not appear appreciably thickened. Stomach/Bowel: There is marked distention of multiple loops of colon with evidence of a degree of sigmoid volvulus. The sigmoid is largely in the upper abdomen with marked distention. The twisting the mesenteric E is  seen along the more distal aspect of the of sigmoid in the midline region of the pelvis. There is no appreciable small bowel obstruction. No free air or portal venous air is evident. Vascular/Lymphatic: There is atherosclerotic calcification in the aorta and iliac arteries. Major mesenteric vessels appear patent, although there is moderate atherosclerotic calcification in major mesenteric vessels. No adenopathy is appreciable in the abdomen or pelvis. Reproductive: Several calcified uterine leiomyomas are noted in the pelvis, largest measuring 3.4 x 2.9 cm. No other pelvic masses are evident. Other: No periappendiceal region inflammation noted. No abscess or ascites evident in the abdomen or pelvis. 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 There is degenerative change throughout the lower thoracic and lumbar spine regions. There are no blastic or lytic bone lesions. There is no intramuscular or abdominal wall lesion. IMPRESSION: 1. Marked dilatation of most of the colon with evidence of sigmoid volvulus. Twisted no mesenteric is seen in the midline at the junction of  the mid the distal sigmoid. 2.  No small bowel obstruction.  No free air. 3. Hiatal hernia containing contrast, likely due to the distal obstruction from the sigmoid volvulus. 4. Aortoiliac atherosclerosis. No aneurysm evident. Coronary artery calcification also evident. 5. Adrenal hypertrophy bilaterally. Significance of this finding uncertain. 6.  No evident renal or ureteral calculus.  No hydronephrosis. 7.  Several calcified uterine leiomyomas. Aortic Atherosclerosis (ICD10-I70.0). Critical Value/emergent results were called by telephone at the time of interpretation on 12/12/2016 at 5:57 pm to [facility] , who verbally acknowledged these results. Electronically Signed   By: Lowella Grip III M.D.   On: 12/31/2016 17:58   Dg Abd Acute W/chest  Result Date: 01/02/2017 CLINICAL DATA:   Generalized abdominal pain and distension associated with nausea and constipation for the past 4 days. No current chest symptoms. EXAM: DG ABDOMEN ACUTE W/ 1V CHEST COMPARISON:  No recent studies in PACs FINDINGS: There are severely distended bowel loops within the abdomen. One loop measures up to 13 cm in diameter. It is not possible to determine whether this is small or large-bowel though I favor large bowel. No definite free extraluminal gas collections are observed. The lungs are hypoinflated but clear. The heart and pulmonary vascularity are normal. IMPRESSION: Severely distended bowel loops with air and fluid. No definite perforation. Abdominal and pelvic CT scanning is recommended. No acute cardiopulmonary abnormality. I cannot exclude a sigmoid volvulus in the appropriate clinical setting. Electronically Signed   By: David  Martinique M.D.   On: 01/05/2017 16:31      Assessment & Plan:    Principal Problem:   Sigmoid volvulus ([place]) Active Problems:   Hypokalemia   Leukocytosis   ARF (acute renal failure) (HCC)   Hyponatremia    Sigmoid volvulus NPO GI consult to decompress Surgery consult, appreciate input  Hypokalemia Replete Check cmp in am  ARF Check urine sodium, urine creatinine, urine eosinophils  Hydrate with NS iv Check cmp in am STOP valsartan/hydrochlorothiazide  Hypercalcemia No calcium Hydrate with ns iv  Hyponatremia Check serum osm, cortisol, tsh Check urine osm, urine sodium Hydrate with ns iv  Check cmp in am  Hyperlipidemia Cont pravastatin  Hypothyroidism Cont levothyroxine  Chronic right hip wound Cont keflex  DVT Prophylaxis Heparin - SCDs   AM Labs Ordered, also please review Full Orders  Family Communication: Admission, patients condition and plan of care including tests being ordered have been discussed with the patient  who indicate understanding and agree with the plan and Code Status.  Code Status FULL CODE  Likely DC to   home  Condition GUARDED    Consults called: surgery and GI by ED  Admission status: inpatient  Time spent in minutes : 45   Jani Gravel M.D on 12/19/2016 at 9:30 PM  Between 7pm to 7am - Pager - [phone] . After 7am go to [email or web address] - password [facility]  [facility] - Office  [phone]

## 2016-12-14 NOTE — ED Notes (Signed)
Family member came to the nurse first desk stating that the patient was complaining of dizziness. Patient was taken back to triage and an EKG was done and given to EDP.

## 2016-12-14 NOTE — Op Note (Signed)
[facility] Patient Name: Erica Suarez Procedure Date : 12/29/2016 MRN: [id number] Attending MD: Graylin Shiver , MD Date of Birth: 04/14/1940 CSN: [id number] Age: 76 Admit Type: Emergency Department Procedure:                Flexible Sigmoidoscopy Indications:              Abnormal CT of the GI tract Providers:                Graylin Shiver, MD, Harold Barban, RN, Zoila Shutter,                            Technician Referring MD:              Medicines:                Midazolam 1 mg IV Complications:            No immediate complications. Estimated Blood Loss:     Estimated blood loss: none. Procedure:                Pre-Anesthesia Assessment:                           - Prior to the procedure, a History and Physical                            was performed, and patient medications and                            allergies were reviewed. The patient's tolerance of                            previous anesthesia was also reviewed. The risks                            and benefits of the procedure and the sedation                            options and risks were discussed with the patient.                            All questions were answered, and informed consent                            was obtained. Prior Anticoagulants: The patient has                            taken no previous anticoagulant or antiplatelet                            agents. ASA Grade Assessment: II - A patient with                            mild systemic disease. After reviewing the risks  and benefits, the patient was deemed in                            satisfactory condition to undergo the procedure.                           After obtaining informed consent, the scope was                            passed under direct vision. The Duodenoscope was                            introduced through the anus and advanced to the the                            descending  colon. The flexible sigmoidoscopy was                            accomplished without difficulty. The patient                            tolerated the procedure well. The quality of the                            bowel preparation was (unprepped). Scope In: Scope Out: Findings:      The perianal and digital rectal examinations were normal.      The sigmoid colon revealed excessive looping.The mucosa in the rectum       and distal sigmoid looked normal. The lumen narrowed in the sigmoid       consistent with the known volvulus. The scope was negotiated past the       volvulus then the lumen opened up and was very dilated. Air was       suctioned out and the abdomen became much less distended and softer, but       it was still distended. Patient reported relief. The mucosa above the       volvulus was markedley abnormal and appears ischemic.      A sigmoid volvulus, with diffuse ischemia above the volvulus was found       in the sigmoid colon and distal descending colon. Decompression of the       volvulus was done, and partial decompression was achieved. The scope       could not be advanced past the distal descending because of edema and       stool. Impression:               - There was significant looping of the colon.                           VOLVULUS.                           - No specimens collected. Recommendation:           - NPO. Procedure Code(s):        --- Professional ---  [phone], Sigmoidoscopy, flexible; with decompression                            (for pathologic distention) (eg, volvulus,                            megacolon), including placement of decompression                            tube, when performed Diagnosis Code(s):        --- Professional ---                           R93.3, Abnormal findings on diagnostic imaging of                            other parts of digestive tract CPT copyright 2016 American Medical Association. All  rights reserved. The codes documented in this report are preliminary and upon coder review may  be revised to meet current compliance requirements. Graylin ShiverSalem F Takenya Travaglini, MD 01/03/2017 9:02:22 PM This report has been signed electronically. Number of Addenda: 0

## 2016-12-14 NOTE — ED Notes (Signed)
MD Criss AlvineGoldston notified of elevated I-stat lactic result.

## 2016-12-14 NOTE — Consult Note (Signed)
Subjective:   HPI  The patient is a 76 year old female who over the last 4 or 5 days states that she has been experiencing progressive abdominal distention and no bowel movements. This has never happened before. She is typically not constipated. She has never had a colonoscopy. She came to the emergency department where she was evaluated and CT scan showed evidence of colonic dilatation and a sigmoid volvulus. Patient was seen by Dr. Cliffton AstersWhite from surgery. Urgent GI consult was requested for sigmoidoscopy for the sigmoid volvulus and decompression of the colon.  Review of Systems No chest pain or shortness of breath.  Past Medical History:  Diagnosis Date  . Estrogen deficiency   . Hypercholesterolemia   . Hypertension   . Methicillin susceptible Staphylococcus aureus infection, unspecified site   . Osteopenia   . Thyroid disease    History reviewed. No pertinent surgical history. Social History   Socioeconomic History  . Marital status: Divorced    Spouse name: Not on file  . Number of children: Not on file  . Years of education: Not on file  . Highest education level: Not on file  Social Needs  . Financial resource strain: Not on file  . Food insecurity - worry: Not on file  . Food insecurity - inability: Not on file  . Transportation needs - medical: Not on file  . Transportation needs - non-medical: Not on file  Occupational History  . Not on file  Tobacco Use  . Smoking status: Never Smoker  . Smokeless tobacco: Never Used  Substance and Sexual Activity  . Alcohol use: No    Frequency: Never  . Drug use: No  . Sexual activity: Not on file  Other Topics Concern  . Not on file  Social History Narrative  . Not on file   family history is not on file.  Current Facility-Administered Medications:  .  iopamidol (ISOVUE-300) 61 % injection, , , ,  .  potassium chloride SA (K-DUR,KLOR-CON) CR tablet 40 mEq, 40 mEq, Oral, Once, Fayrene Helperran, [place], PA-C  Current Outpatient  Medications:  .  aspirin EC 81 MG tablet, Take 81 mg by mouth daily., Disp: , Rfl:  .  Calcium Carb-Cholecalciferol (CALCIUM 600+D) 600-800 MG-UNIT TABS, Take 1 tablet by mouth 2 (two) times daily., Disp: , Rfl:  .  cephALEXin (KEFLEX) 250 MG capsule, Take 750 mg by mouth 2 (two) times daily., Disp: , Rfl:  .  glycerin adult 2 g suppository, Place 1 suppository rectally as needed for constipation., Disp: , Rfl:  .  levothyroxine (SYNTHROID, LEVOTHROID) 50 MCG tablet, Take 50 mcg by mouth daily., Disp: , Rfl:  .  Multiple Vitamins-Minerals (CENTRUM SILVER 50+WOMEN) TABS, Take 1 tablet by mouth daily., Disp: , Rfl:  .  Omega-3 Fatty Acids (FISH OIL) 1200 MG CAPS, Take 2,400 mg by mouth daily., Disp: , Rfl:  .  pravastatin (PRAVACHOL) 40 MG tablet, Take 40 mg by mouth at bedtime. , Disp: , Rfl:  .  raloxifene (EVISTA) 60 MG tablet, Take 60 mg by mouth daily., Disp: , Rfl:  .  valsartan-hydrochlorothiazide (DIOVAN-HCT) 160-25 MG per tablet, Take 1 tablet by mouth daily., Disp: , Rfl:  .  vitamin E 400 UNIT capsule, Take 400 Units by mouth 2 (two) times daily. , Disp: , Rfl:  .  Wheat Dextrin (BENEFIBER) POWD, Mix 1 tablespoonful into eight ounces of water and drink two times a day as needed for mild constipation, Disp: , Rfl:  .  calcium carbonate (OS-CAL)  600 MG TABS, Take 1,200 mg by mouth daily., Disp: , Rfl:  .  polyethylene glycol powder (MIRALAX) powder, Take 255 g by mouth every hour as needed. (Patient not taking: Reported on 01/09/2017), Disp: 255 g, Rfl: 0 Allergies  Allergen Reactions  . Morphine And Related Other (See Comments)    "Makes me wacky"  . Tape Other (See Comments)    PULLS OFF THE SKIN; PLEASE USE AN ALTERNATIVE!!     Objective:     BP 113/77   Pulse 76   Temp 98.2 F (36.8 C) (Oral)   Resp (!) 25   Ht 5\' 8"  (1.727 m)   Wt 99.8 kg (220 lb)   SpO2 94%   BMI 33.45 kg/m   She is alert and in no acute distress  Heart regular rhythm no murmurs  Lungs  clear  Abdomen markedly distended and tympanitic, not tender  Laboratory No components found for: D1    Assessment:     Sigmoid volvulus      Plan:     Urgent sigmoidoscopy with decompression. I explained the procedure to the patient along with the potential risks of bleeding, infection, perforation. She understands this may or may not work. We will proceed this evening. Lab Results  Component Value Date   HGB 12.0 03/14/2016   HCT 36.1 03/14/2016   ALKPHOS 106 03/14/2016   AST 61 (H) 03/14/2016   ALT 30 03/14/2016

## 2016-12-14 NOTE — ED Triage Notes (Signed)
Patient from home with complaint of constipation x3 days. Takes daily suppository for constipation, has had no bowel movement for three days. Alert and oriented at this time. Also complains of generalized weakness starting this morning.

## 2016-12-14 NOTE — ED Notes (Signed)
GI at bedside

## 2016-12-15 ENCOUNTER — Inpatient Hospital Stay (HOSPITAL_COMMUNITY): Payer: Medicare Other | Admitting: Certified Registered Nurse Anesthetist

## 2016-12-15 ENCOUNTER — Inpatient Hospital Stay (HOSPITAL_COMMUNITY): Payer: Medicare Other

## 2016-12-15 ENCOUNTER — Inpatient Hospital Stay (HOSPITAL_COMMUNITY): Payer: Medicare Other | Admitting: Certified Registered"

## 2016-12-15 ENCOUNTER — Other Ambulatory Visit: Payer: Self-pay

## 2016-12-15 ENCOUNTER — Encounter (HOSPITAL_COMMUNITY): Payer: Self-pay | Admitting: Gastroenterology

## 2016-12-15 ENCOUNTER — Encounter (HOSPITAL_COMMUNITY): Admission: EM | Disposition: E | Payer: Self-pay | Source: Home / Self Care | Attending: Pulmonary Disease

## 2016-12-15 DIAGNOSIS — K562 Volvulus: Principal | ICD-10-CM

## 2016-12-15 DIAGNOSIS — D72829 Elevated white blood cell count, unspecified: Secondary | ICD-10-CM

## 2016-12-15 DIAGNOSIS — J9601 Acute respiratory failure with hypoxia: Secondary | ICD-10-CM

## 2016-12-15 DIAGNOSIS — G9340 Encephalopathy, unspecified: Secondary | ICD-10-CM

## 2016-12-15 DIAGNOSIS — I469 Cardiac arrest, cause unspecified: Secondary | ICD-10-CM

## 2016-12-15 DIAGNOSIS — R579 Shock, unspecified: Secondary | ICD-10-CM

## 2016-12-15 HISTORY — PX: LAPAROTOMY: SHX154

## 2016-12-15 LAB — COOXEMETRY PANEL
CARBOXYHEMOGLOBIN: 0.4 % — AB (ref 0.5–1.5)
CARBOXYHEMOGLOBIN: 0.2 % — AB (ref 0.5–1.5)
METHEMOGLOBIN: 0.9 % (ref 0.0–1.5)
Methemoglobin: 1.2 % (ref 0.0–1.5)
O2 SAT: 94.2 %
O2 Saturation: 54.3 %
Total hemoglobin: 10.2 g/dL — ABNORMAL LOW (ref 12.0–16.0)
Total hemoglobin: 10.5 g/dL — ABNORMAL LOW (ref 12.0–16.0)

## 2016-12-15 LAB — CBC
HCT: 32.6 % — ABNORMAL LOW (ref 36.0–46.0)
Hemoglobin: 10.7 g/dL — ABNORMAL LOW (ref 12.0–15.0)
MCH: 24.1 pg — AB (ref 26.0–34.0)
MCHC: 32.8 g/dL (ref 30.0–36.0)
MCV: 73.4 fL — ABNORMAL LOW (ref 78.0–100.0)
PLATELETS: 325 10*3/uL (ref 150–400)
RBC: 4.44 MIL/uL (ref 3.87–5.11)
RDW: 16.4 % — AB (ref 11.5–15.5)
WBC: 24.3 10*3/uL — ABNORMAL HIGH (ref 4.0–10.5)

## 2016-12-15 LAB — POCT I-STAT 3, ART BLOOD GAS (G3+)
ACID-BASE DEFICIT: 10 mmol/L — AB (ref 0.0–2.0)
Acid-base deficit: 16 mmol/L — ABNORMAL HIGH (ref 0.0–2.0)
Acid-base deficit: 12 mmol/L — ABNORMAL HIGH (ref 0.0–2.0)
BICARBONATE: 17.3 mmol/L — AB (ref 20.0–28.0)
BICARBONATE: 15.2 mmol/L — AB (ref 20.0–28.0)
Bicarbonate: 12.3 mmol/L — ABNORMAL LOW (ref 20.0–28.0)
O2 SAT: 84 %
O2 SAT: 81 %
O2 Saturation: 86 %
PCO2 ART: 38.7 mmHg (ref 32.0–48.0)
PCO2 ART: 37.8 mmHg (ref 32.0–48.0)
PO2 ART: 65 mmHg — AB (ref 83.0–108.0)
Patient temperature: 98.5
TCO2: 13 mmol/L — AB (ref 22–32)
TCO2: 19 mmol/L — AB (ref 22–32)
TCO2: 16 mmol/L — ABNORMAL LOW (ref 22–32)
pCO2 arterial: 38.3 mmHg (ref 32.0–48.0)
pH, Arterial: 7.111 — CL (ref 7.350–7.450)
pH, Arterial: 7.249 — ABNORMAL LOW (ref 7.350–7.450)
pH, Arterial: 7.21 — ABNORMAL LOW (ref 7.350–7.450)
pO2, Arterial: 45 mmHg — ABNORMAL LOW (ref 83.0–108.0)
pO2, Arterial: 61 mmHg — ABNORMAL LOW (ref 83.0–108.0)

## 2016-12-15 LAB — COMPREHENSIVE METABOLIC PANEL
ALK PHOS: 71 U/L (ref 38–126)
ALT: 35 U/L (ref 14–54)
AST: 93 U/L — ABNORMAL HIGH (ref 15–41)
Albumin: 2.2 g/dL — ABNORMAL LOW (ref 3.5–5.0)
Anion gap: 15 (ref 5–15)
BUN: 64 mg/dL — AB (ref 6–20)
CALCIUM: 9.2 mg/dL (ref 8.9–10.3)
CO2: 18 mmol/L — AB (ref 22–32)
CREATININE: 2.94 mg/dL — AB (ref 0.44–1.00)
Chloride: 98 mmol/L — ABNORMAL LOW (ref 101–111)
GFR, EST AFRICAN AMERICAN: 17 mL/min — AB (ref >60)
GFR, EST NON AFRICAN AMERICAN: 14 mL/min — AB (ref >60)
Glucose, Bld: 75 mg/dL (ref 65–99)
Potassium: 3.3 mmol/L — ABNORMAL LOW (ref 3.5–5.1)
SODIUM: 131 mmol/L — AB (ref 135–145)
Total Bilirubin: 0.5 mg/dL (ref 0.3–1.2)
Total Protein: 5 g/dL — ABNORMAL LOW (ref 6.5–8.1)

## 2016-12-15 LAB — TROPONIN I
TROPONIN I: 0.09 ng/mL — AB (ref <0.03)
TROPONIN I: 0.14 ng/mL — AB (ref <0.03)

## 2016-12-15 LAB — MRSA PCR SCREENING: MRSA by PCR: NEGATIVE

## 2016-12-15 LAB — CORTISOL: Cortisol, Plasma: >100 ug/dL

## 2016-12-15 SURGERY — LAPAROTOMY, EXPLORATORY
Anesthesia: General | Site: Abdomen

## 2016-12-15 MED ORDER — SODIUM CHLORIDE 0.9 % IV SOLN
2000.0000 mg | Freq: Once | INTRAVENOUS | Status: AC
  Administered 2016-12-15: 2000 mg via INTRAVENOUS
  Filled 2016-12-15: qty 2000

## 2016-12-15 MED ORDER — SODIUM BICARBONATE 8.4 % IV SOLN
50.0000 meq | Freq: Once | INTRAVENOUS | Status: AC
  Administered 2016-12-15: 50 meq via INTRAVENOUS

## 2016-12-15 MED ORDER — LIDOCAINE 2% (20 MG/ML) 5 ML SYRINGE
INTRAMUSCULAR | Status: AC
Start: 2016-12-15 — End: ?
  Filled 2016-12-15: qty 5

## 2016-12-15 MED ORDER — POTASSIUM CHLORIDE CRYS ER 20 MEQ PO TBCR
60.0000 meq | EXTENDED_RELEASE_TABLET | Freq: Once | ORAL | Status: AC
  Administered 2016-12-15: 60 meq via ORAL
  Filled 2016-12-15: qty 3

## 2016-12-15 MED ORDER — DOPAMINE-DEXTROSE 3.2-5 MG/ML-% IV SOLN
0.0000 ug/kg/min | INTRAVENOUS | Status: DC
  Administered 2016-12-15: 5 ug/kg/min via INTRAVENOUS
  Administered 2016-12-16: 15 ug/kg/min via INTRAVENOUS
  Administered 2016-12-16 (×2): 20 ug/kg/min via INTRAVENOUS
  Filled 2016-12-15 (×3): qty 250

## 2016-12-15 MED ORDER — MIDAZOLAM HCL 2 MG/2ML IJ SOLN
INTRAMUSCULAR | Status: AC
  Filled 2016-12-15: qty 2

## 2016-12-15 MED ORDER — SODIUM CHLORIDE 0.9% FLUSH
10.0000 mL | Freq: Two times a day (BID) | INTRAVENOUS | Status: DC
  Administered 2016-12-15: 20 mL

## 2016-12-15 MED ORDER — VECURONIUM BROMIDE 10 MG IV SOLR
INTRAVENOUS | Status: DC | PRN
  Administered 2016-12-15: 10 mg via INTRAVENOUS

## 2016-12-15 MED ORDER — SODIUM CHLORIDE 0.9 % IV SOLN: INTRAVENOUS | Status: DC

## 2016-12-15 MED ORDER — PHENYLEPHRINE 40 MCG/ML (10ML) SYRINGE FOR IV PUSH (FOR BLOOD PRESSURE SUPPORT)
PREFILLED_SYRINGE | INTRAVENOUS | Status: DC | PRN
  Administered 2016-12-15: 120 ug via INTRAVENOUS

## 2016-12-15 MED ORDER — 0.9 % SODIUM CHLORIDE (POUR BTL) OPTIME
TOPICAL | Status: DC | PRN
  Administered 2016-12-15 (×2): 3000 mL

## 2016-12-15 MED ORDER — SODIUM BICARBONATE 8.4 % IV SOLN
INTRAVENOUS | Status: DC | PRN
  Administered 2016-12-15 (×3): 50 meq via INTRAVENOUS

## 2016-12-15 MED ORDER — SODIUM BICARBONATE 8.4 % IV SOLN
INTRAVENOUS | Status: AC
  Filled 2016-12-15: qty 50

## 2016-12-15 MED ORDER — DEXTROSE 5 % IV SOLN
0.0000 ug/min | INTRAVENOUS | Status: DC
  Administered 2016-12-15: 40 ug/min via INTRAVENOUS
  Administered 2016-12-15: 5 ug/min via INTRAVENOUS
  Administered 2016-12-15 (×3): 40 ug/min via INTRAVENOUS
  Filled 2016-12-15 (×4): qty 4

## 2016-12-15 MED ORDER — CHLORHEXIDINE GLUCONATE CLOTH 2 % EX PADS: 6.0000 | MEDICATED_PAD | Freq: Once | CUTANEOUS | Status: DC

## 2016-12-15 MED ORDER — ANIDULAFUNGIN 100 MG IV SOLR
100.0000 mg | INTRAVENOUS | Status: DC
  Filled 2016-12-15: qty 100

## 2016-12-15 MED ORDER — VASOPRESSIN 20 UNIT/ML IV SOLN
0.0300 [IU]/min | INTRAVENOUS | Status: DC
  Administered 2016-12-15: 0.03 [IU]/min via INTRAVENOUS
  Filled 2016-12-15: qty 2

## 2016-12-15 MED ORDER — SODIUM CHLORIDE 0.9 % IV BOLUS (SEPSIS)
1000.0000 mL | Freq: Once | INTRAVENOUS | Status: AC
  Administered 2016-12-15: 1000 mL via INTRAVENOUS

## 2016-12-15 MED ORDER — DEXTROSE 5 % IV SOLN
0.5000 ug/min | INTRAVENOUS | Status: DC
  Administered 2016-12-15: 17 ug/min via INTRAVENOUS
  Administered 2016-12-15: 20 ug/min via INTRAVENOUS
  Administered 2016-12-16: 10 ug/min via INTRAVENOUS
  Administered 2016-12-16: 12 ug/min via INTRAVENOUS
  Administered 2016-12-16: 18 ug/min via INTRAVENOUS
  Administered 2016-12-16: 20 ug/min via INTRAVENOUS
  Filled 2016-12-15 (×5): qty 4

## 2016-12-15 MED ORDER — ROCURONIUM BROMIDE 10 MG/ML (PF) SYRINGE
PREFILLED_SYRINGE | INTRAVENOUS | Status: DC | PRN
  Administered 2016-12-15: 50 mg via INTRAVENOUS

## 2016-12-15 MED ORDER — SODIUM BICARBONATE 8.4 % IV SOLN
INTRAVENOUS | Status: DC
  Administered 2016-12-15: 15:00:00 via INTRAVENOUS
  Administered 2016-12-15: 100 mL/h via INTRAVENOUS
  Administered 2016-12-16: via INTRAVENOUS
  Filled 2016-12-15 (×4): qty 150

## 2016-12-15 MED ORDER — FENTANYL CITRATE (PF) 250 MCG/5ML IJ SOLN
INTRAMUSCULAR | Status: AC
  Filled 2016-12-15: qty 5

## 2016-12-15 MED ORDER — FAMOTIDINE IN NACL 20-0.9 MG/50ML-% IV SOLN
20.0000 mg | INTRAVENOUS | Status: DC
  Administered 2016-12-15 – 2016-12-16 (×2): 20 mg via INTRAVENOUS
  Filled 2016-12-15 (×2): qty 50

## 2016-12-15 MED ORDER — MIDAZOLAM HCL 5 MG/5ML IJ SOLN
INTRAMUSCULAR | Status: DC | PRN
  Administered 2016-12-15: 2 mg via INTRAVENOUS

## 2016-12-15 MED ORDER — PROPOFOL 500 MG/50ML IV EMUL
INTRAVENOUS | Status: DC | PRN
  Administered 2016-12-15: 15 ug/kg/min via INTRAVENOUS

## 2016-12-15 MED ORDER — VASOPRESSIN 20 UNIT/ML IV SOLN
0.0400 [IU]/min | INTRAVENOUS | Status: DC
  Administered 2016-12-15: 0.03 [IU]/min via INTRAVENOUS
  Administered 2016-12-16: 0.04 [IU]/min via INTRAVENOUS
  Filled 2016-12-15: qty 2

## 2016-12-15 MED ORDER — CHLORHEXIDINE GLUCONATE 0.12 % MT SOLN
15.0000 mL | Freq: Two times a day (BID) | OROMUCOSAL | Status: DC
  Administered 2016-12-15 – 2016-12-16 (×3): 15 mL via OROMUCOSAL
  Filled 2016-12-15: qty 15

## 2016-12-15 MED ORDER — ORAL CARE MOUTH RINSE
15.0000 mL | Freq: Two times a day (BID) | OROMUCOSAL | Status: DC
  Administered 2016-12-15 – 2016-12-16 (×2): 15 mL via OROMUCOSAL

## 2016-12-15 MED ORDER — CEFOTETAN DISODIUM 2 G IJ SOLR
2.0000 g | INTRAMUSCULAR | Status: AC
  Administered 2016-12-15: 2 g via INTRAVENOUS
  Filled 2016-12-15 (×2): qty 2

## 2016-12-15 MED ORDER — ALUM & MAG HYDROXIDE-SIMETH 200-200-20 MG/5ML PO SUSP
30.0000 mL | ORAL | Status: DC | PRN
  Administered 2016-12-15: 30 mL via ORAL
  Filled 2016-12-15 (×2): qty 30

## 2016-12-15 MED ORDER — NOREPINEPHRINE BITARTRATE 1 MG/ML IV SOLN
0.0000 ug/min | INTRAVENOUS | Status: DC
  Administered 2016-12-15: 50 ug/min via INTRAVENOUS
  Administered 2016-12-16 (×5): 80 ug/min via INTRAVENOUS
  Administered 2016-12-16: 70 ug/min via INTRAVENOUS
  Administered 2016-12-16: 80 ug/min via INTRAVENOUS
  Filled 2016-12-15 (×5): qty 16

## 2016-12-15 MED ORDER — PIPERACILLIN-TAZOBACTAM IN DEX 2-0.25 GM/50ML IV SOLN
2.2500 g | Freq: Four times a day (QID) | INTRAVENOUS | Status: DC
  Administered 2016-12-15 – 2016-12-16 (×4): 2.25 g via INTRAVENOUS
  Filled 2016-12-15 (×8): qty 50

## 2016-12-15 MED ORDER — LEVOTHYROXINE SODIUM 100 MCG IV SOLR
25.0000 ug | Freq: Every day | INTRAVENOUS | Status: DC
Start: 2016-12-16 — End: 2016-12-16
  Administered 2016-12-16: 25 ug via INTRAVENOUS
  Filled 2016-12-15: qty 5

## 2016-12-15 MED ORDER — PROPOFOL 10 MG/ML IV BOLUS
INTRAVENOUS | Status: DC | PRN
  Administered 2016-12-15: 50 mg via INTRAVENOUS

## 2016-12-15 MED ORDER — SODIUM CHLORIDE 0.9% FLUSH: 10.0000 mL | INTRAVENOUS | Status: DC | PRN

## 2016-12-15 MED ORDER — FENTANYL CITRATE (PF) 100 MCG/2ML IJ SOLN
INTRAMUSCULAR | Status: DC | PRN
  Administered 2016-12-15 (×3): 50 ug via INTRAVENOUS

## 2016-12-15 MED ORDER — VECURONIUM BROMIDE 10 MG IV SOLR
INTRAVENOUS | Status: AC
  Filled 2016-12-15: qty 10

## 2016-12-15 MED ORDER — SODIUM CHLORIDE 0.9 % IV SOLN
200.0000 mg | Freq: Once | INTRAVENOUS | Status: AC
  Administered 2016-12-15: 200 mg via INTRAVENOUS
  Filled 2016-12-15: qty 200

## 2016-12-15 MED ORDER — NOREPINEPHRINE BITARTRATE 1 MG/ML IV SOLN
0.0000 ug/min | INTRAVENOUS | Status: DC
  Filled 2016-12-15 (×2): qty 4

## 2016-12-15 MED ORDER — DEXTROSE 5 % IV SOLN
2.0000 g | INTRAVENOUS | Status: DC
  Filled 2016-12-15: qty 2

## 2016-12-15 MED ORDER — CHLORHEXIDINE GLUCONATE 0.12% ORAL RINSE (MEDLINE KIT): 15.0000 mL | Freq: Two times a day (BID) | OROMUCOSAL | Status: DC

## 2016-12-15 MED ORDER — DOBUTAMINE IN D5W 4-5 MG/ML-% IV SOLN
2.5000 ug/kg/min | INTRAVENOUS | Status: DC
  Administered 2016-12-15: 2.5 ug/kg/min via INTRAVENOUS
  Filled 2016-12-15: qty 250

## 2016-12-15 MED ORDER — EPINEPHRINE PF 1 MG/ML IJ SOLN
0.5000 ug/min | INTRAVENOUS | Status: DC
  Administered 2016-12-15: 3 ug/min via INTRAVENOUS
  Filled 2016-12-15: qty 4

## 2016-12-15 MED ORDER — ROCURONIUM BROMIDE 10 MG/ML (PF) SYRINGE
PREFILLED_SYRINGE | INTRAVENOUS | Status: AC
  Filled 2016-12-15: qty 5

## 2016-12-15 MED ORDER — CHLORHEXIDINE GLUCONATE CLOTH 2 % EX PADS: 6.0000 | MEDICATED_PAD | Freq: Every day | CUTANEOUS | Status: DC

## 2016-12-15 MED ORDER — PROPOFOL 10 MG/ML IV BOLUS
INTRAVENOUS | Status: AC
  Filled 2016-12-15: qty 20

## 2016-12-15 MED ORDER — ORAL CARE MOUTH RINSE: 15.0000 mL | Freq: Four times a day (QID) | OROMUCOSAL | Status: DC

## 2016-12-15 MED ORDER — EPINEPHRINE PF 1 MG/10ML IJ SOSY
PREFILLED_SYRINGE | INTRAMUSCULAR | Status: DC | PRN
  Administered 2016-12-15 (×5): 0.1 mg via INTRAVENOUS

## 2016-12-15 SURGICAL SUPPLY — 42 items
BLADE CLIPPER SURG (BLADE) IMPLANT
CANISTER SUCT 3000ML PPV (MISCELLANEOUS) ×3 IMPLANT
CHLORAPREP W/TINT 26ML (MISCELLANEOUS) ×3 IMPLANT
COVER SURGICAL LIGHT HANDLE (MISCELLANEOUS) ×3 IMPLANT
DRAPE LAPAROSCOPIC ABDOMINAL (DRAPES) ×3 IMPLANT
DRAPE WARM FLUID 44X44 (DRAPE) ×3 IMPLANT
DRSG OPSITE POSTOP 4X10 (GAUZE/BANDAGES/DRESSINGS) IMPLANT
DRSG OPSITE POSTOP 4X8 (GAUZE/BANDAGES/DRESSINGS) IMPLANT
ELECT BLADE 6.5 EXT (BLADE) IMPLANT
ELECT CAUTERY BLADE 6.4 (BLADE) ×3 IMPLANT
ELECT REM PT RETURN 9FT ADLT (ELECTROSURGICAL) ×3
ELECTRODE REM PT RTRN 9FT ADLT (ELECTROSURGICAL) ×1 IMPLANT
GLOVE BIO SURGEON STRL SZ8 (GLOVE) ×3 IMPLANT
GLOVE BIOGEL PI IND STRL 8 (GLOVE) ×1 IMPLANT
GLOVE BIOGEL PI INDICATOR 8 (GLOVE) ×2
GOWN STRL REUS W/ TWL LRG LVL3 (GOWN DISPOSABLE) ×1 IMPLANT
GOWN STRL REUS W/ TWL XL LVL3 (GOWN DISPOSABLE) ×2 IMPLANT
GOWN STRL REUS W/TWL LRG LVL3 (GOWN DISPOSABLE) ×2
GOWN STRL REUS W/TWL XL LVL3 (GOWN DISPOSABLE) ×4
KIT BASIN OR (CUSTOM PROCEDURE TRAY) ×3 IMPLANT
KIT ROOM TURNOVER OR (KITS) ×3 IMPLANT
LIGASURE IMPACT 36 18CM CVD LR (INSTRUMENTS) ×3 IMPLANT
NS IRRIG 1000ML POUR BTL (IV SOLUTION) ×6 IMPLANT
PACK GENERAL/GYN (CUSTOM PROCEDURE TRAY) ×3 IMPLANT
PAD ARMBOARD 7.5X6 YLW CONV (MISCELLANEOUS) ×3 IMPLANT
RELOAD PROXIMATE 75MM BLUE (ENDOMECHANICALS) ×3 IMPLANT
SPECIMEN JAR LARGE (MISCELLANEOUS) IMPLANT
SPONGE ABD ABTHERA ADVANCE (MISCELLANEOUS) ×3 IMPLANT
SPONGE LAP 18X18 X RAY DECT (DISPOSABLE) ×9 IMPLANT
STAPLER CUT CVD 40MM BLUE (STAPLE) ×3 IMPLANT
STAPLER PROXIMATE 75MM BLUE (STAPLE) ×3 IMPLANT
STAPLER VISISTAT 35W (STAPLE) ×3 IMPLANT
SUCTION POOLE TIP (SUCTIONS) ×3 IMPLANT
SUT PDS AB 1 TP1 96 (SUTURE) ×6 IMPLANT
SUT VIC AB 2-0 SH 18 (SUTURE) ×3 IMPLANT
SUT VIC AB 3-0 SH 18 (SUTURE) ×3 IMPLANT
SUT VICRYL AB 2 0 TIES (SUTURE) ×3 IMPLANT
SUT VICRYL AB 3 0 TIES (SUTURE) ×3 IMPLANT
TOWEL OR 17X24 6PK STRL BLUE (TOWEL DISPOSABLE) ×3 IMPLANT
TOWEL OR 17X26 10 PK STRL BLUE (TOWEL DISPOSABLE) ×3 IMPLANT
TRAY FOLEY W/METER SILVER 16FR (SET/KITS/TRAYS/PACK) IMPLANT
YANKAUER SUCT BULB TIP NO VENT (SUCTIONS) IMPLANT

## 2016-12-15 NOTE — Procedures (Signed)
Central Venous Catheter Insertion Procedure Note Erica Suarez [id number] November 16, 1940  Procedure: Insertion of Central Venous Catheter Indications: Assessment of intravascular volume, Drug and/or fluid administration and Frequent blood sampling  Procedure Details Consent: Unable to obtain consent because of emergent medical necessity. Time Out: Verified patient identification, verified procedure, site/side was marked, verified correct patient position, special equipment/implants available, medications/allergies/relevent history reviewed, required imaging and test results available.  Performed  Maximum sterile technique was used including antiseptics, cap, gloves, gown, hand hygiene, mask and sheet. Skin prep: Chlorhexidine; local anesthetic administered  A antimicrobial bonded/coated triple lumen catheter was placed in the right internal jugular vein to 16 cm using the Seldinger technique.  Evaluation Blood flow good Complications: No apparent complications Patient did tolerate procedure well. Chest X-ray ordered to verify placement.  CXR: pending.   Procedure performed under direct supervision of Dr. Molli KnockYacoub and with ultrasound guidance for real time vessel cannulation.     Erica BrimBrandi Ollis, NP-C [facility] Pgr: [phone] or if no answer [phone] 12/12/2016, 2:20 PM  I was present and supervised the procedure  Erica Suarez, M.D. [facility]. Pager: [phone]. After hours pager: [phone].

## 2016-12-15 NOTE — Transfer of Care (Signed)
Immediate Anesthesia Transfer of Care Note  Patient: Erica Suarez  Procedure(s) Performed: Total abdominal colectomy for Sigmoid Volvulus (N/A Abdomen)  Patient Location: ICU  Anesthesia Type:General  Level of Consciousness: sedated and Patient remains intubated per anesthesia plan  Airway & Oxygen Therapy: Patient remains intubated per anesthesia plan and Patient placed on Ventilator (see vital sign flow sheet for setting)  Post-op Assessment: report given to RN and vital signs stable on pressors.  Post vital signs: Reviewed and stable on pressors  Last Vitals:  Vitals:   03/20/2016 1510 03/20/2016 1515  BP: (!) 94/49 (!) 67/41  Pulse: 83 87  Resp: (!) 35 (!) 34  Temp:    SpO2: (!) 84% (!) 88%    Last Pain:  Vitals:   03/20/2016 1013  TempSrc:   PainSc: 0-No pain         Complications: No apparent anesthesia complications

## 2016-12-15 NOTE — Progress Notes (Signed)
Responded to code blue page . Made several calls to family at [phone] daughter Belmont DesanctisKathryn Hill and home number. No answer.  Patient going to 2M03.  Venida JarvisWatlington, Deshara Rossi, [place], [facility], Pager [phone]

## 2016-12-15 NOTE — Progress Notes (Signed)
Patient was transported to the OR on full vent support with CRNA without any complications. Vent is on standby in the OR.

## 2016-12-15 NOTE — Progress Notes (Signed)
eLink Physician-Brief Progress Note Patient Name: Erica LovelyMargaret E Suarez DOB: 1940-09-08 MRN: [id number]   Date of Service  12/13/2016  HPI/Events of Note  SvO2 54.3. Mildly acidotic despite maximal ventilation. Remains on multiple vasopressor agents.   eICU Interventions  1. Continuing current ventilator settings 2. Starting dobutamine infusion 3. Repeat SvO2 at 11pm 4. Plan to initiate weaning of epinephrine first if patient is tolerant of dobutamine     Intervention Category Major Interventions: Shock - evaluation and management;Sepsis - evaluation and management  Lawanda CousinsJennings Almina Schul 12/20/2016, 9:05 PM

## 2016-12-15 NOTE — Procedures (Signed)
Arterial Catheter Insertion Procedure Note Erica Suarez [id number] 17-Mar-1940  Procedure: Insertion of Arterial Catheter  Indications: Blood pressure monitoring and Frequent blood sampling  Procedure Details Consent: Unable to obtain consent because of emergent medical necessity. Time Out: Verified patient identification, verified procedure, site/side was marked, verified correct patient position, special equipment/implants available, medications/allergies/relevent history reviewed, required imaging and test results available.  Performed  Maximum sterile technique was used including antiseptics, cap, gloves, gown, hand hygiene, mask and sheet. Skin prep: Chlorhexidine; local anesthetic administered 20 gauge catheter was inserted into left femoral artery using the Seldinger technique.  Evaluation Blood flow good; BP tracing good. Complications: No apparent complications.   Koren BoundYACOUB,Kileen Lange 12/14/2016

## 2016-12-15 NOTE — Progress Notes (Signed)
Central telemetry called the unit that patient on asystole, checked patient and was noted unresponsive, pale with vomitus, called code ,CPR started, suctioned patient , and code team came.

## 2016-12-15 NOTE — Progress Notes (Signed)
eLink Physician-Brief Progress Note Patient Name: Hinton LovelyMargaret E Bissette DOB: March 08, 1940 MRN: [id number]   Date of Service  12/29/2016  HPI/Events of Note  Significant hypotension with dobutamine infusion.   eICU Interventions  Switching from dobutamine to dopamine infusion      Intervention Category Major Interventions: Shock - evaluation and management;Sepsis - evaluation and management  Lawanda CousinsJennings Arieh Bogue 12/20/2016, 10:01 PM

## 2016-12-15 NOTE — H&P (View-Only) (Signed)
Events noted  Case discussed with GI No further decompression endoscopically recommended by GI Pt will need ex lap at this point since concern for recurrent volvulus Pt probably aspirated which led to asystole. On pressors but stabilizing  High risk of complications and will need colostomy May need open abdomen as well Ischemia probably secondary to volvulus since lactate upon presentation was 2 Hypoxemia now supports aspiration  The procedure has been discussed with the family .  Alternative therapies have been discussed with the patient.  Operative risks include bleeding,  Infection,  Organ injury,  Nerve injury,  Blood vessel injury, ostomy,  DVT,  Pulmonary embolism,  Death, and possible reoperation.  Medical management risks include worsening of present situation. The success of the procedure is 50 -90 % at treating patients symptoms. High morbidity and mortality risk.  

## 2016-12-15 NOTE — Consult Note (Addendum)
WOC Nurse wound consult note Reason for Consult: Consult requested for left hip.  Pt has a dark reddish purple deep tissue injury to buttocks; approx 15X12cm Wound type: Left hip with full thickness wound; appears to be a previous surgical wound. 2X.3X2.5cm; mod amt green-tinged drainage, no odor. Pressure Injury POA: Yes Dressing procedure/placement/frequency: Pt has a foam dressing to protect buttocks and is on a low air loss bed to reduce pressure. Iodoform packing to left hip to absorb drainage.  No family present to discuss plan of care. Please re-consult if further assistance is needed.  Thank-you,  Cammie Mcgeeawn Raysha Tilmon MSN, RN, CWOCN, [place], CNS [phone]

## 2016-12-15 NOTE — Code Documentation (Addendum)
  Patient Name: Erica Suarez   MRN: [id number]   Date of Birth/ Sex: [birth year] , female      Admission Date: 12/27/2016  Attending Provider: Glade LloydAlekh, Kshitiz, MD  Primary Diagnosis: Sigmoid volvulus [facility](HCC)   Indication: Pt was in her usual state of health until this PM, when she was noted to be asystole. Code blue was subsequently called. At the time of arrival on scene, ACLS protocol was underway.   Technical Description:  - CPR performance duration:  13 minutes  - Was defibrillation or cardioversion used? Yes   - Was external pacer placed? No  - Was patient intubated pre/post CPR? Yes   Medications Administered: Y = Yes; Blank = No Amiodarone    Atropine    Calcium    Epinephrine  y  Lidocaine    Magnesium  y  Norepinephrine    Phenylephrine    Sodium bicarbonate  y  Vasopressin     Post CPR evaluation:  - Final Status - Was patient successfully resuscitated ? Yes - What is current rhythm? NSR - What is current hemodynamic status? Hemodynamically stable   Miscellaneous Information:  - Labs sent, including: CMP,CBC, Troponin, EKG, Chest x-ray  - Primary team notified?  Yes  - Family Notified? No  - Additional notes/ transfer status: Family was not able to be reached. The patient lives alone with a friend. Went with pt to 47M and signed out to Dr. Molli KnockYacoub.      Lorenso Courierhundi, Daisi Kentner, MD  01/03/2017, 12:45 PM

## 2016-12-15 NOTE — Anesthesia Postprocedure Evaluation (Signed)
Anesthesia Post Note  Patient: Erica Suarez  Procedure(s) Performed: Total abdominal colectomy for Sigmoid Volvulus (N/A Abdomen)     Patient location during evaluation: SICU Anesthesia Type: General Level of consciousness: sedated Pain management: pain level controlled Vital Signs Assessment: post-procedure vital signs reviewed and stable Respiratory status: patient remains intubated per anesthesia plan Cardiovascular status: stable Postop Assessment: no apparent nausea or vomiting Anesthetic complications: no    Last Vitals:  Vitals:   01/02/2017 1515 12/30/2016 1718  BP: (!) 67/41   Pulse: 87 87  Resp: (!) 34 (!) 30  Temp:    SpO2: (!) 88% 90%    Last Pain:  Vitals:   01/08/2017 1013  TempSrc:   PainSc: 0-No pain                 Ravindra Baranek DAVID

## 2016-12-15 NOTE — Progress Notes (Signed)
Pt's BP was 90/36. Pt asymptomatic. Recheck manual BP=94/54. Made on call aware. Will continue to monitor pt.

## 2016-12-15 NOTE — Progress Notes (Signed)
Pharmacy Antibiotic Note  Erica LovelyMargaret E Vanetten is a 76 y.o. female admitted on 01/06/2017 with abdominal pain.  Found to have sigmoid volvulus now s/p sigmoidoscopy with decompression on 12/10/2016.  Patient coded on 01/05/2017 and transferred to the ICU.  Pharmacy has been consulted for Zosyn dosing for abdominal coverage and possible aspiration during code.  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  Patient has renal dysfunction, unsure of baseline, estimated CrCL ~19 ml/min.  Plan: Zosyn 2.25gm IV Q6H Monitor renal fxn, clinical progress   Height: 5\' 8"  (172.7 cm) Weight: 214 lb 1.1 oz (97.1 kg) IBW/kg (Calculated) : 63.9  Temp (24hrs), Avg:97.7 F (36.5 C), Min:97.6 F (36.4 C), Max:97.7 F (36.5 C)  Recent Labs  Lab 07-27-16 1254 07-27-16 1641 07-27-16 2230 12/23/2016 0509  WBC 16.8*  --   --  24.3*  CREATININE 2.34*  --  2.48* 2.94*  LATICACIDVEN  --  2.16*  --   --     Estimated Creatinine Clearance: 19.5 mL/min (A) (by C-G formula based on SCr of 2.94 mg/dL (H)).    Allergies  Allergen Reactions  . Morphine And Related Other (See Comments)    "Makes me wacky"  . Tape Other (See Comments)    PULLS OFF THE SKIN; PLEASE USE AN ALTERNATIVE!!     Zosyn 12/6 >> Keflex PTA >> 12/6  12/6 MRSA PCR -    Tiersa Dayley D. Laney Potashang, PharmD, BCPS Pager:  [phone] 12/22/2016, 1:51 PM

## 2016-12-15 NOTE — Op Note (Addendum)
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 Emelia LoronMatthew Wakefield MD  Anesthesia: General  EBL 200 cc  Specimen total abdominal colon to pathology  Drains: None  IV fluids: Per anesthesia record  Indications for procedure: The patient is a 76 year old female admitted last night with a sigmoid volvulus.  She underwent colonoscopic decompression.  She felt better overnight and early this morning.  As the day progressed, she had an episode of vomiting was found unresponsive in her room.  She was asystole and a CODE BLUE was called and she was transferred to the ICU.  Plain films of her abdomen revealed significant colonic dilatation without free air.  GI was reconsulted and did not feel any further decompression would be of benefit to her.  She was brought to the operating room for exploration.  She was intubated and on inotropic support.The procedure has been discussed with the patient.  Alternative therapies have been discussed with the patient.  Operative risks include bleeding,  Infection,  Organ injury, ureteral injury.  Open abdomen,  Nerve injury,  Blood vessel injury,  DVT,  Pulmonary embolism,  Death, ostomy, and  possible reoperation.  Medical management risks include worsening of present situation.  The success of the procedure is 50 -90 % at treating patients symptoms.  The patient understands and agrees to proceed.   Description of procedure: The patient was brought emergently from the ICU intubated.  She is placed supine on the OR table.  An appropriate level of anesthesia was initiated.  She was on inotropic support and resuscitated by anesthesia.  Midline incision was used after timeout and sterile prep and drape.  Dissection was carried through the abdominal Wall into the abdominal  cavity.  Upon entering there was free air.  Retractors were placed.  The sigmoid colon was twisted consistent with volvulus.  There is perforation and liquefactive necrosis noted.  The sigmoid colon was of was still twisted this was untwisted.  The transverse colon was viable.  The descending colon was viable but the cecum was distended with areas of patchy ischemia of the cecum.  The colon overall was viable without ischemia and this appeared to be secondary to obstruction secondary to volvulus and subsequent liquefactive necrosis and perforation.  The distal sigmoid colon was divided with a concave stapling device using a blue firing.  Options take the entire colon out since it was dilated and appeared to be chronically diseased.  LigaSure was used to take the mesentery down.  The left ureter was identified and preserved.  Splenic flexure was taken down.  Transverse colon was taken down with LigaSure.  The descending colon was taken down in similar fashion.  The terminal ileum was divided with a GIA-75 stapling device.  The specimen was passed off the field.  There is gross fecal contamination because of the perforation.  The abdominal cavity was irrigated.  Hemostasis achieved.  The small intestine was viable with good pulses and good flow.  A wound VAC was placed in standard fashion and The abdomen was kept open.  Her airway pressures were much improved.  She was taken back intubated in critical but stable condition to the ICU. All final counts were found to be correct.  The patient will return in 48 hours for second-look operation and possible closure with ileostomy.

## 2016-12-15 NOTE — Progress Notes (Signed)
Events noted  Case discussed with GI No further decompression endoscopically recommended by GI Pt will need ex lap at this point since concern for recurrent volvulus Pt probably aspirated which led to asystole. On pressors but stabilizing  High risk of complications and will need colostomy May need open abdomen as well Ischemia probably secondary to volvulus since lactate upon presentation was 2 Hypoxemia now supports aspiration  The procedure has been discussed with the family .  Alternative therapies have been discussed with the patient.  Operative risks include bleeding,  Infection,  Organ injury,  Nerve injury,  Blood vessel injury, ostomy,  DVT,  Pulmonary embolism,  Death, and possible reoperation.  Medical management risks include worsening of present situation. The success of the procedure is 50 -90 % at treating patients symptoms. High morbidity and mortality risk.

## 2016-12-15 NOTE — Interval H&P Note (Signed)
History and Physical Interval Note:  01/05/2017 2:37 PM  Erica Suarez  has presented today for surgery, with the diagnosis of pain  The various methods of treatment have been discussed with the patient and family. After consideration of risks, benefits and other options for treatment, the patient has consented to  Procedure(s): EXPLORATORY LAPAROTOMY (N/A) as a surgical intervention .  The patient's history has been reviewed, patient examined, no change in status, stable for surgery.  I have reviewed the patient's chart and labs.  Questions were answered to the patient's satisfaction.     Arianni Gallego A Lynessa Almanzar

## 2016-12-15 NOTE — Progress Notes (Addendum)
Pharmacy Antibiotic Note  Erica Suarez is a 76 y.o. female admitted on 12/28/2016 with abdominal pain.  Found to have sigmoid volvulus now s/p sigmoidoscopy with decompression on 12/11/2016.  Pharmacy was initially consulted for Zosyn dosing for abdominal coverage and possible aspiration during code.  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 She is in ICU s/p code with ongoing shock and multi-organ failure. MD is broadening antibiotic coverage with Eraxis and requested pharmacy to dose Vancomycin.   Patient has renal dysfunction which is trending up, unsure of baseline, estimated CrCL ~19 ml/min. Patient is hypothermic and hemodynamically unstable on multiple pressors. Lactic acid is elevated at 2.16. MRSA pcr is negative. No other cultures are available.   Plan: Vancomycin 2g IV x1.  Due to changing/worsening renal function, will check level at 48 hours to determine next dose unless renal function improves.    Height: 5\' 8"  (172.7 cm) Weight: 214 lb 1.1 oz (97.1 kg) IBW/kg (Calculated) : 63.9  Temp (24hrs), Avg:96 F (35.6 C), Min:94.3 F (34.6 C), Max:97.7 F (36.5 C)  Recent Labs  Lab 12/13/2016 1254 01/07/2017 1641 01/03/2017 2230 12/14/2016 0509  WBC 16.8*  --   --  24.3*  CREATININE 2.34*  --  2.48* 2.94*  LATICACIDVEN  --  2.16*  --   --     Estimated Creatinine Clearance: 19.5 mL/min (A) (by C-G formula based on SCr of 2.94 mg/dL (H)).    Allergies  Allergen Reactions  . Morphine And Related Other (See Comments)    "Makes me wacky"  . Tape Other (See Comments)    PULLS OFF THE SKIN; PLEASE USE AN ALTERNATIVE!!     Zosyn 12/6 >> Keflex PTA >> 12/6 Vancomycin 12/6 >> Eraxis 12/6 >>  12/6 MRSA PCR - negative   Link SnufferJessica Gedeon Brandow, PharmD, BCPS, BCCCP Clinical Pharmacist Clinical phone 01/06/2017 until 11PM - [phone] After hours, please call #[phone] 12/29/2016, 7:56 PM

## 2016-12-15 NOTE — Progress Notes (Signed)
Tech went to go see if patient would like to go ahead and wash up as she previously stated she would like to do. When the tech went in she expressed how now she was tired and would like to rest for a bit. Tech provided everything needed for a bath and will come back later to do it when she is feeling up for it.

## 2016-12-15 NOTE — Progress Notes (Signed)
[facility]/Trauma Progress Note  1 Day Post-Op   Assessment/Plan Principal Problem:   Sigmoid volvulus (HCC) Active Problems:   Hypokalemia   Leukocytosis   ARF (acute renal failure) (HCC)   Hyponatremia  Sigmoid volvulus  - S/P partial decompression, Dr. Evette Cristal, 12/05 - pt still distended but feels better, having flatus  FEN: clears VTE: SCD's, heparin ID: keflex 12/05 Follow up: TBD  DISPO: will hold off on bowel prep until renal function improves, will plan on bowel prep tomorrow with surgery likely over the weekend or Monday. We will follow    LOS: 1 day    Subjective:  ZO:[id number] distention  Pt states less distention although still swollen. Less pain. No nausea. Having flatus.   Objective: Vital signs in last 24 hours: Temp:  [97.6 F (36.4 C)-98.2 F (36.8 C)] 97.6 F (36.4 C) (12/06 0449) Pulse Rate:  [69-103] 93 (12/06 0449) Resp:  [13-37] 16 (12/06 0449) BP: (90-190)/(36-154) 94/54 (12/06 0539) SpO2:  [90 %-97 %] 96 % (12/06 0449) Weight:  [214 lb 1.1 oz (97.1 kg)-220 lb (99.8 kg)] 214 lb 1.1 oz (97.1 kg) (12/05 2115) Last BM Date: 12/11/16  Intake/Output from previous day: 12/05 0701 - 12/06 0700 In: 2790 [P.O.:120; I.V.:1570; IV Piggyback:1100] Out: 0  Intake/Output this shift: No intake/output data recorded.  PE: Gen:  Alert, NAD, pleasant, cooperative Card:  RRR, no M/G/R heard Pulm:  Rate and effort normal Abd: somewhat firm, distended, hypoactive BS, tympanic, nontender Skin: no rashes noted, warm and dry   Anti-infectives: Anti-infectives (From admission, onward)   Start     Dose/Rate Route Frequency Ordered Stop   01/09/2017 2215  cephALEXin (KEFLEX) capsule 750 mg     750 mg Oral 2 times daily 12/22/2016 2206        Lab Results:  Recent Labs    01/02/2017 1254 12/23/2016 0509  WBC 16.8* 24.3*  HGB 12.0 10.7*  HCT 36.1 32.6*  PLT 403* 325   BMET Recent Labs    01/08/2017 1254 12/18/2016 2230 12/23/2016 0509  NA  127*  --  131*  K 2.4*  --  3.3*  CL 88*  --  98*  CO2 20*  --  18*  GLUCOSE 139*  --  75  BUN 56*  --  64*  CREATININE 2.34* 2.48* 2.94*  CALCIUM 11.2*  --  9.2   PT/INR No results for input(s): LABPROT, INR in the last 72 hours. CMP     Component Value Date/Time   NA 131 (L) 12/17/2016 0509   K 3.3 (L) 12/10/2016 0509   CL 98 (L) 12/14/2016 0509   CO2 18 (L) 12/23/2016 0509   GLUCOSE 75 12/31/2016 0509   BUN 64 (H) 01/06/2017 0509   CREATININE 2.94 (H) 12/19/2016 0509   CALCIUM 9.2 01/05/2017 0509   PROT 5.0 (L) 01/04/2017 0509   ALBUMIN 2.2 (L) 12/31/2016 0509   AST 93 (H) 12/23/2016 0509   ALT 35 12/11/2016 0509   ALKPHOS 71 12/11/2016 0509   BILITOT 0.5 01/09/2017 0509   GFRNONAA 14 (L) 01/02/2017 0509   GFRAA 17 (L) 12/15/2016 0509   Lipase     Component Value Date/Time   LIPASE 39 12/13/2016 1254    Studies/Results: Ct Abdomen Pelvis Wo Contrast  Result Date: 12/17/2016 CLINICAL DATA:  Abdominal distention and constipation EXAM: CT ABDOMEN AND PELVIS WITHOUT CONTRAST TECHNIQUE: Multidetector CT imaging of the abdomen and pelvis was performed following the standard protocol without IV contrast. Oral contrast was administered. COMPARISON:  Abdominal radiographs December 14, 2016 FINDINGS: Lower chest: There is atelectatic change in the left base. Lung bases otherwise are clear. There are foci of coronary artery calcification. There is a focal hiatal hernia which contains contrast. Hepatobiliary: No focal liver lesions are evident. Gallbladder is not appreciable on this study. There is no biliary duct dilatation. Pancreas: There is no pancreatic mass or inflammatory focus. Spleen: No splenic lesions are evident. Adrenals/Urinary Tract: There is adrenal hypertrophy bilaterally. Kidneys bilaterally show no evident mass or hydronephrosis on either side. There is no renal or ureteral calculus on either side. Urinary bladder is not well seen due to susceptibility artifact from  total hip replacements. Urinary bladder wall does not appear appreciably thickened. Stomach/Bowel: There is marked distention of multiple loops of colon with evidence of a degree of sigmoid volvulus. The sigmoid is largely in the upper abdomen with marked distention. The twisting the mesenteric E is seen along the more distal aspect of the of sigmoid in the midline region of the pelvis. There is no appreciable small bowel obstruction. No free air or portal venous air is evident. Vascular/Lymphatic: There is atherosclerotic calcification in the aorta and iliac arteries. Major mesenteric vessels appear patent, although there is moderate atherosclerotic calcification in major mesenteric vessels. No adenopathy is appreciable in the abdomen or pelvis. Reproductive: Several calcified uterine leiomyomas are noted in the pelvis, largest measuring 3.4 x 2.9 cm. No other pelvic masses are evident. Other: No periappendiceal region inflammation noted. No abscess or ascites evident in the abdomen or pelvis. 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 There is degenerative change throughout the lower thoracic and lumbar spine regions. There are no blastic or lytic bone lesions. There is no intramuscular or abdominal wall lesion. IMPRESSION: 1. Marked dilatation of most of the colon with evidence of sigmoid volvulus. Twisted no mesenteric is seen in the midline at the junction of the mid the distal sigmoid. 2.  No small bowel obstruction.  No free air. 3. Hiatal hernia containing contrast, likely due to the distal obstruction from the sigmoid volvulus. 4. Aortoiliac atherosclerosis. No aneurysm evident. Coronary artery calcification also evident. 5. Adrenal hypertrophy bilaterally. Significance of this finding uncertain. 6.  No evident renal or ureteral calculus.  No hydronephrosis. 7.  Several calcified uterine leiomyomas. Aortic Atherosclerosis (ICD10-I70.0). Critical Value/emergent  results were called by telephone at the time of interpretation on 12/20/2016 at 5:57 pm to [facility] LLCBOWIE TRAN, PA , who verbally acknowledged these results. Electronically Signed   By: Bretta BangWilliam  Woodruff III M.D.   On: 12/30/2016 17:58   Dg Abd Acute W/chest  Result Date: 01/04/2017 CLINICAL DATA:  Generalized abdominal pain and distension associated with nausea and constipation for the past 4 days. No current chest symptoms. EXAM: DG ABDOMEN ACUTE W/ 1V CHEST COMPARISON:  No recent studies in PACs FINDINGS: There are severely distended bowel loops within the abdomen. One loop measures up to 13 cm in diameter. It is not possible to determine whether this is small or large-bowel though I favor large bowel. No definite free extraluminal gas collections are observed. The lungs are hypoinflated but clear. The heart and pulmonary vascularity are normal. IMPRESSION: Severely distended bowel loops with air and fluid. No definite perforation. Abdominal and pelvic CT scanning is recommended. No acute cardiopulmonary abnormality. I cannot exclude a sigmoid volvulus in the appropriate clinical setting. Electronically Signed   By: David  Swazil[place]Jordan M.D.   On: 12/17/2016 16:31      Jerre SimonJessica L Casen Pryor ,  [facility] 12/20/2016, 8:23 AM Pager: [phone] Consults: [phone] Mon-Fri 7:00 am-4:30 pm Sat-Sun 7:00 am-11:30 am

## 2016-12-15 NOTE — Anesthesia Preprocedure Evaluation (Addendum)
Anesthesia Evaluation  Patient identified by MRN, date of birth, ID band Patient unresponsive    Reviewed: Allergy & Precautions, NPO status , Patient's Chart, lab work & pertinent test results  Airway Mallampati: Intubated       Dental   Pulmonary    Pulmonary exam normal        Cardiovascular hypertension, Normal cardiovascular exam     Neuro/Psych    GI/Hepatic   Endo/Other    Renal/GU      Musculoskeletal   Abdominal   Peds  Hematology   Anesthesia Other Findings   Reproductive/Obstetrics                            Anesthesia Physical Anesthesia Plan  ASA: IV  Anesthesia Plan: General   Post-op Pain Management:    Induction: Inhalational  PONV Risk Score and Plan: 3 and Treatment may vary due to age or medical condition  Airway Management Planned: Oral ETT  Additional Equipment:   Intra-op Plan:   Post-operative Plan: Post-operative intubation/ventilation  Informed Consent: I have reviewed the patients History and Physical, chart, labs and discussed the procedure including the risks, benefits and alternatives for the proposed anesthesia with the patient or authorized representative who has indicated his/her understanding and acceptance.     Plan Discussed with: CRNA and Surgeon  Anesthesia Plan Comments:        Anesthesia Quick Evaluation

## 2016-12-15 NOTE — Progress Notes (Signed)
Patient ID: Erica Suarez, female   DOB: 11-13-1940, 76 y.o.   MRN: [id number]   Informed of recent events by hospitalist and Dr. Molli KnockYacoub. Request for decompression tube. Based on my experience in a patient with a sigmoid volvulus who has evidence of ischemia on attempted decompression I do not feel placing a decompression tube will be beneficial in the long-term and with the evidence of ischemia I think she needs exploratory surgery. Discussed with Dr. Luisa Hartornett and expressed my opinion on this issue and he will evaluate further. Dr. Molli KnockYacoub aware.

## 2016-12-15 NOTE — Progress Notes (Signed)
Pt has not urinate and stated "I don't have to go." Bladder scan shows 29ml urine in the bladder. Will continue to monitor pt.

## 2016-12-15 NOTE — Transfer of Care (Signed)
Immediate Anesthesia Transfer of Care Note  Patient: Erica Suarez  Procedure(s) Performed: AN AD HOC INTUBATION  Patient Location: ICU  Anesthesia Type:no anesthesia given  Level of Consciousness: Patient remains intubated per anesthesia plan  Airway & Oxygen Therapy: Patient remains intubated per anesthesia plan and Patient placed on Ventilator (see vital sign flow sheet for setting)  Post-op Assessment: Report given to RN  Post vital signs: Reviewed and stable  Patient with patent airway, code in progress Last Vitals:  Vitals:   12/30/2016 0900 12/29/2016 1305  BP:    Pulse:  92  Resp:  19  Temp:    SpO2: 94% 95%    Last Pain:  Vitals:   12/10/2016 1013  TempSrc:   PainSc: 0-No pain         Complications: No apparent anesthesia complications

## 2016-12-15 NOTE — Progress Notes (Signed)
Unable to collect urine specimen for pt still unable to urinate. Bladder scan shows 41ml. Pt does not have any urge to urinate. Made NP on call aware; no new order received at this time. Will continue to monitor pt.

## 2016-12-15 NOTE — Progress Notes (Addendum)
eLink Physician-Brief Progress Note Patient Name: Erica Suarez DOB: 04-19-1940 MRN: [id number]   Date of Service  01/06/2017  HPI/Events of Note  76 year old female with multisystem organ failure and ongoing shock. BP slowly improving. Rate increased on ventilator to maximize ventilation. Saturation marginal. Currently on bicarbonate infusion, epinephrine infusion, vasopressin infusion, and Levophed infusion. Serum cortisol greater than 100. Currently only on Zosyn.   eICU Interventions  1. Checking stat SvO2 2. ABG ordered for 8pm 3. Continuing current ventilator settings  4. Broadening antibiotic coverage with Eraxis and vancomycin.      Intervention Category Major Interventions: Acid-Base disturbance - evaluation and management;Shock - evaluation and management;Respiratory failure - evaluation and management  Lawanda CousinsJennings [name] 12/15/2016, 7:36 PM

## 2016-12-15 NOTE — Progress Notes (Signed)
[facility]  Erica Suarez 76 y.o. 1940-01-15   Subjective: Feels better since colonic decompression. Denies abdominal pain.  Objective: Vital signs: Vitals:   12/17/2016 0539 12/21/2016 0900  BP: (!) 94/54   Pulse:    Resp:    Temp:    SpO2:  94%  T 97.6, P 93, R 16  Physical Exam: Gen: lethargic, eldlery, well-nourished, no acute distress  HEENT: anicteric sclera CV: RRR Chest: CTA B Abd: +distention, decreased bowel sounds, nontender Ext: no edema  Lab Results: Recent Labs    01/08/2017 1254 12/17/2016 2230 12/30/2016 0509  NA 127*  --  131*  K 2.4*  --  3.3*  CL 88*  --  98*  CO2 20*  --  18*  GLUCOSE 139*  --  75  BUN 56*  --  64*  CREATININE 2.34* 2.48* 2.94*  CALCIUM 11.2*  --  9.2   Recent Labs    12/13/2016 1254 12/20/2016 0509  AST 61* 93*  ALT 30 35  ALKPHOS 106 71  BILITOT 0.7 0.5  PROT 7.4 5.0*  ALBUMIN 3.4* 2.2*   Recent Labs    12/29/2016 1254 01/05/2017 0509  WBC 16.8* 24.3*  HGB 12.0 10.7*  HCT 36.1 32.6*  MCV 73.2* 73.4*  PLT 403* 325      Assessment/Plan: Sigmoid volvulus - s/p successful colonic decompression yesterday by Dr. Evette CristalGanem. Surgery tentatively planned for Monday. Will sign off. Call if questions. Dr. Dulce Sellarutlaw on call this weekend if needed.   Asa Baudoin C. 01/09/2017, 10:14 AM  Pager [phone]  AFTER 5 PM or on weekends please call [phone] ID: Erica LovelyMargaret E Suarez, female   DOB: 1940-01-15, 76 y.o.   MRN: [id number]

## 2016-12-15 NOTE — Progress Notes (Signed)
Patient ID: Erica Suarez, female   DOB: 20-Mar-1940, 76 y.o.   MRN: [id number]  PROGRESS NOTE    Erica Suarez  HQI:[id number] DOB: 27-Jul-1940 DOA: 12/27/2016 PCP: Elias Else, MD   Brief Narrative:  76 year old female with history of hypertension, hyperlipidemia, osteopenia presented on 12/24/2016 with abdominal distention for 2 days. She was found to have sigmoid volvulus. She underwent colonic decompression by GI. Gen. surgery also following.   Assessment & Plan:   Principal Problem:   Sigmoid volvulus (HCC) Active Problems:   Hypokalemia   Leukocytosis   ARF (acute renal failure) (HCC)   Hyponatremia    Sigmoid volvulus - Status post colonic decompression by GI. GI has signed off -Patient much improved. Diet as per general surgery recommendations -General surgery following and is planning for sigmoid colectomy probably on Monday  Hypokalemia - Replace. Repeat a.m. labs  Leukocytosis -Probably reactive. Repeat a.m. labs  Acute renal failure -Creatinine worsening. Increase normal saline to 125 mL an hour - Renal ultrasound - Repeat a.m. labs   Hypercalcemia - Resolved with hydration  Hyponatremia - Probably from dehydration. Improving. 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  DVT prophylaxis: Start heparin Code Status:  Full Family Communication: None at bedside Disposition Plan: Depends on clinical outcome  Consultants: GI and general surgery  Procedures:  Flexible sigmoidoscopy on 12/30/2016 Findings:      The perianal and digital rectal examinations were normal.      The sigmoid colon revealed excessive looping.The mucosa in the rectum       and distal sigmoid looked normal. The lumen narrowed in the sigmoid       consistent with the known volvulus. The scope was negotiated past the       volvulus then the lumen opened up and was  very dilated. Air was       suctioned out and the abdomen became much less distended and softer, but       it was still distended. Patient reported relief. The mucosa above the       volvulus was markedley abnormal and appears ischemic.      A sigmoid volvulus, with diffuse ischemia above the volvulus was found       in the sigmoid colon and distal descending colon. Decompression of the       volvulus was done, and partial decompression was achieved. The scope       could not be advanced past the distal descending because of edema and       stool. Impression:               - There was significant looping of the colon.                           VOLVULUS.    Antimicrobials: None Subjective: Patient seen and examined at bedside. She feels much better. Her abdominal pain is improving. No overnight fever.  Objective: Vitals:   01/05/2017 2115 12/17/2016 0449 12/17/2016 0539 12/21/2016 0900  BP: (!) 97/58 (!) 90/36 (!) 94/54   Pulse: 75 93    Resp: 18 16    Temp: 97.7 F (36.5 C) 97.6 F (36.4 C)    TempSrc: Oral Oral    SpO2: 97% 96%  94%  Weight: 97.1 kg (214 lb 1.1 oz)     Height: 5\' 8"  (1.727 m)  Intake/Output Summary (Last 24 hours) at 12/10/2016 1048 Last data filed at 12/11/2016 0600 Gross per 24 hour  Intake 2790 ml  Output 0 ml  Net 2790 ml   Filed Weights   01/03/2017 1249 01/05/2017 2115  Weight: 99.8 kg (220 lb) 97.1 kg (214 lb 1.1 oz)    Examination:  General exam: Appears calm and comfortable  Respiratory system: Bilateral decreased breath sound at bases Cardiovascular system: S1 & S2 heard, rate controlled  Gastrointestinal system: Abdomen is distended, soft and nontender. Decreased bowel sounds Extremities: No cyanosis, clubbing, edema    Data Reviewed: I have personally reviewed following labs and imaging studies  CBC: Recent Labs  Lab 12/11/2016 1254 10/04/2016 0509  WBC 16.8* 24.3*  HGB 12.0 10.7*  HCT 36.1 32.6*  MCV 73.2* 73.4*  PLT 403* 325    Basic Metabolic Panel: Recent Labs  Lab 12/28/2016 1254 12/31/2016 2230 10/04/2016 0509  NA 127*  --  131*  K 2.4*  --  3.3*  CL 88*  --  98*  CO2 20*  --  18*  GLUCOSE 139*  --  75  BUN 56*  --  64*  CREATININE 2.34* 2.48* 2.94*  CALCIUM 11.2*  --  9.2   GFR: Estimated Creatinine Clearance: 19.5 mL/min (A) (by C-G formula based on SCr of 2.94 mg/dL (H)). Liver Function Tests: Recent Labs  Lab 12/27/2016 1254 10/04/2016 0509  AST 61* 93*  ALT 30 35  ALKPHOS 106 71  BILITOT 0.7 0.5  PROT 7.4 5.0*  ALBUMIN 3.4* 2.2*   Recent Labs  Lab 01/05/2017 1254  LIPASE 39   No results for input(s): AMMONIA in the last 168 hours. Coagulation Profile: No results for input(s): INR, PROTIME in the last 168 hours. Cardiac Enzymes: No results for input(s): CKTOTAL, CKMB, CKMBINDEX, TROPONINI in the last 168 hours. BNP (last 3 results) No results for input(s): PROBNP in the last 8760 hours. HbA1C: No results for input(s): HGBA1C in the last 72 hours. CBG: No results for input(s): GLUCAP in the last 168 hours. Lipid Profile: No results for input(s): CHOL, HDL, LDLCALC, TRIG, CHOLHDL, LDLDIRECT in the last 72 hours. Thyroid Function Tests: Recent Labs    12/19/2016 2230  TSH 1.937   Anemia Panel: No results for input(s): VITAMINB12, FOLATE, FERRITIN, TIBC, IRON, RETICCTPCT in the last 72 hours. Sepsis Labs: Recent Labs  Lab 01/02/2017 1641  LATICACIDVEN 2.16*    No results found for this or any previous visit (from the past 240 hour(s)).       Radiology Studies: Ct Abdomen Pelvis Wo Contrast  Result Date: 12/22/2016 CLINICAL DATA:  Abdominal distention and constipation EXAM: CT ABDOMEN AND PELVIS WITHOUT CONTRAST TECHNIQUE: Multidetector CT imaging of the abdomen and pelvis was performed following the standard protocol without IV contrast. Oral contrast was administered. COMPARISON:  Abdominal radiographs December 14, 2016 FINDINGS: Lower chest: There is atelectatic change in the  left base. Lung bases otherwise are clear. There are foci of coronary artery calcification. There is a focal hiatal hernia which contains contrast. Hepatobiliary: No focal liver lesions are evident. Gallbladder is not appreciable on this study. There is no biliary duct dilatation. Pancreas: There is no pancreatic mass or inflammatory focus. Spleen: No splenic lesions are evident. Adrenals/Urinary Tract: There is adrenal hypertrophy bilaterally. Kidneys bilaterally show no evident mass or hydronephrosis on either side. There is no renal or ureteral calculus on either side. Urinary bladder is not well seen due to susceptibility artifact from total hip replacements. Urinary  bladder wall does not appear appreciably thickened. Stomach/Bowel: There is marked distention of multiple loops of colon with evidence of a degree of sigmoid volvulus. The sigmoid is largely in the upper abdomen with marked distention. The twisting the mesenteric E is seen along the more distal aspect of the of sigmoid in the midline region of the pelvis. There is no appreciable small bowel obstruction. No free air or portal venous air is evident. Vascular/Lymphatic: There is atherosclerotic calcification in the aorta and iliac arteries. Major mesenteric vessels appear patent, although there is moderate atherosclerotic calcification in major mesenteric vessels. No adenopathy is appreciable in the abdomen or pelvis. Reproductive: Several calcified uterine leiomyomas are noted in the pelvis, largest measuring 3.4 x 2.9 cm. No other pelvic masses are evident. Other: No periappendiceal region inflammation noted. No abscess or ascites evident in the abdomen or pelvis. 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 There is degenerative change throughout the lower thoracic and lumbar spine regions. There are no blastic or lytic bone lesions. There is no intramuscular or abdominal wall lesion. IMPRESSION: 1.  Marked dilatation of most of the colon with evidence of sigmoid volvulus. Twisted no mesenteric is seen in the midline at the junction of the mid the distal sigmoid. 2.  No small bowel obstruction.  No free air. 3. Hiatal hernia containing contrast, likely due to the distal obstruction from the sigmoid volvulus. 4. Aortoiliac atherosclerosis. No aneurysm evident. Coronary artery calcification also evident. 5. Adrenal hypertrophy bilaterally. Significance of this finding uncertain. 6.  No evident renal or ureteral calculus.  No hydronephrosis. 7.  Several calcified uterine leiomyomas. Aortic Atherosclerosis (ICD10-I70.0). Critical Value/emergent results were called by telephone at the time of interpretation on 12/10/2016 at 5:57 pm to [facility] , who verbally acknowledged these results. Electronically Signed   By: Bretta BangWilliam  Woodruff III M.D.   On: 01/04/2017 17:58   [place] Renal  Result Date: 01/02/2017 CLINICAL DATA:  Renal failure EXAM: RENAL / URINARY TRACT ULTRASOUND COMPLETE COMPARISON:  CT 12 5 18  FINDINGS: Right Kidney: Length: 8.9 cm.  Increase cortical echogenicity.  No hydronephrosis Left Kidney: Length: 10.9 cm. Increased cortical echogenicity. No hydronephrosis Bladder: Bladder collapsed post void. IMPRESSION: 1. Echogenic kidneys suggest medical renal disease. 2. No hydronephrosis. Electronically Signed   By: Genevive BiStewart  Edmunds M.D.   On: 2016/09/09 09:03   Dg Abd Acute W/chest  Result Date: 01/02/2017 CLINICAL DATA:  Generalized abdominal pain and distension associated with nausea and constipation for the past 4 days. No current chest symptoms. EXAM: DG ABDOMEN ACUTE W/ 1V CHEST COMPARISON:  No recent studies in PACs FINDINGS: There are severely distended bowel loops within the abdomen. One loop measures up to 13 cm in diameter. It is not possible to determine whether this is small or large-bowel though I favor large bowel. No definite free extraluminal gas collections are observed. The lungs are  hypoinflated but clear. The heart and pulmonary vascularity are normal. IMPRESSION: Severely distended bowel loops with air and fluid. No definite perforation. Abdominal and pelvic CT scanning is recommended. No acute cardiopulmonary abnormality. I cannot exclude a sigmoid volvulus in the appropriate clinical setting. Electronically Signed   By: David  Sw[place]il[place]Jordan M.D.   On: 12/29/2016 16:31        Scheduled Meds: . aspirin EC  81 mg Oral Daily  . cephALEXin  750 mg Oral BID  . chlorhexidine  15 mL Mouth Rinse BID  . heparin  5,000 Units Subcutaneous Q8H  .  levothyroxine  50 mcg Oral QAC breakfast  . mouth rinse  15 mL Mouth Rinse q12n4p  . pravastatin  40 mg Oral QHS  . raloxifene  60 mg Oral Daily   Continuous Infusions: . sodium chloride    . 0.9 % NaCl with KCl 20 mEq / L 125 mL/hr at 2016/12/22 0808     LOS: 1 day        Glade Lloyd, MD [facility] Pager [phone]  If 7PM-7AM, please contact night-coverage [email or web address] Password [email or web address] December 22, 2016, 10:49 AM

## 2016-12-15 NOTE — Consult Note (Signed)
PULMONARY / CRITICAL CARE MEDICINE   Name: Erica Suarez MRN: [id number] DOB: 1940/04/20    ADMISSION DATE:  2017-01-08 CONSULTATION DATE:  12/11/2016  REFERRING MD:  Dr. Hanley Ben  CHIEF COMPLAINT:  Cardiac Arrest   HISTORY OF PRESENT ILLNESS:   76 y/o F who presented to [facility] on 12/4 with a 48 hour history acutely worsening abdominal pain.    Per chart review, the patient reported she felt her medication changes were causing her abdominal pain.  She had noted constipation as well.  She had been having discomfort for approximately 2 weeks.  Her last BM was 4 days prior to admit. Initial evaluation included a CT of the Chest which showed evidence of colonic dilatation and a sigmoid volvulus.  The patient was admitted per [facility] for further evaluation.  She was evaluated by GI and CCS. The patient underwent emergent sigmoidoscopy for the sigmoid volvulus & decompression of the colon.  The patient had AKI and surgery was planned for the weekend or Monday when her renal function improved.  The patient unfortunately suffered a cardiac arrest on 12/6 afternoon.  She required ACLS with 3 epinephrine, 1 sodium bicarbonate and 2 gm magnesium during the resuscitative efforts.  The patient was transferred to the ICU for further stabilization.    PAST MEDICAL HISTORY :  She  has a past medical history of Estrogen deficiency, Hypercholesterolemia, Hypertension, Methicillin susceptible Staphylococcus aureus infection, unspecified site, Osteopenia, and Thyroid disease.  PAST SURGICAL HISTORY: She  has a past surgical history that includes Flexible sigmoidoscopy (N/A, 08-Jan-2017).  Allergies  Allergen Reactions  . Morphine And Related Other (See Comments)    "Makes me wacky"  . Tape Other (See Comments)    PULLS OFF THE SKIN; PLEASE USE AN ALTERNATIVE!!    No current facility-administered medications on file prior to encounter.    Current Outpatient Medications on File Prior to Encounter  Medication Sig  .  aspirin EC 81 MG tablet Take 81 mg by mouth daily.  . Calcium Carb-Cholecalciferol (CALCIUM 600+D) 600-800 MG-UNIT TABS Take 1 tablet by mouth 2 (two) times daily.  . cephALEXin (KEFLEX) 250 MG capsule Take 750 mg by mouth 2 (two) times daily.  Marland Kitchen glycerin adult 2 g suppository Place 1 suppository rectally as needed for constipation.  Marland Kitchen levothyroxine (SYNTHROID, LEVOTHROID) 50 MCG tablet Take 50 mcg by mouth daily.  . Multiple Vitamins-Minerals (CENTRUM SILVER 50+WOMEN) TABS Take 1 tablet by mouth daily.  . Omega-3 Fatty Acids (FISH OIL) 1200 MG CAPS Take 2,400 mg by mouth daily.  . pravastatin (PRAVACHOL) 40 MG tablet Take 40 mg by mouth at bedtime.   . raloxifene (EVISTA) 60 MG tablet Take 60 mg by mouth daily.  . valsartan-hydrochlorothiazide (DIOVAN-HCT) 160-25 MG per tablet Take 1 tablet by mouth daily.  . vitamin E 400 UNIT capsule Take 400 Units by mouth 2 (two) times daily.   . Wheat Dextrin (BENEFIBER) POWD Mix 1 tablespoonful into eight ounces of water and drink two times a day as needed for mild constipation  . calcium carbonate (OS-CAL) 600 MG TABS Take 1,200 mg by mouth daily.  . polyethylene glycol powder (MIRALAX) powder Take 255 g by mouth every hour as needed. (Patient not taking: Reported on 2017/01/08)    FAMILY HISTORY:  Her indicated that her mother is deceased. She indicated that her father is deceased.   SOCIAL HISTORY: She  reports that  has never smoked. she has never used smokeless tobacco. She reports that she does not  drink alcohol or use drugs.  REVIEW OF SYSTEMS:  Unable to complete as patient is altered on mechanical ventilation.   SUBJECTIVE:   VITAL SIGNS: BP (!) 94/54 (BP Location: Left Arm)   Pulse 92   Temp 97.6 F (36.4 C) (Oral)   Resp 19   Ht 5\' 8"  (1.727 m)   Wt 214 lb 1.1 oz (97.1 kg)   SpO2 95%   BMI 32.55 kg/m   HEMODYNAMICS:    VENTILATOR SETTINGS: Vent Mode: PRVC FiO2 (%):  [100 %] 100 % Set Rate:  [18 bmp] 18 bmp Vt Set:  [540  mL] 540 mL PEEP:  [5 cmH20] 5 cmH20  INTAKE / OUTPUT: I/O last 3 completed shifts: In: 2790 [P.O.:120; I.V.:1570; IV Piggyback:1100] Out: 0   PHYSICAL EXAMINATION: General: chronically ill appearing elderly female, critically ill on vent   HEENT: MM pink/moist, no ETT  Neuro: obtunded, pupils 3mm sluggish CV: s1s2 rrr, no m/r/g PULM: even/non-labored, lungs bilaterally coarse, faint wheeze ZO:XWRU, non-tender, bsx4 active  Extremities: warm/dry, no edema  Skin: no rashes or lesions  LABS:  BMET Recent Labs  Lab 12-21-16 1254 2016/12/21 2230 01/03/2017 0509  NA 127*  --  131*  K 2.4*  --  3.3*  CL 88*  --  98*  CO2 20*  --  18*  BUN 56*  --  64*  CREATININE 2.34* 2.48* 2.94*  GLUCOSE 139*  --  75    Electrolytes Recent Labs  Lab 2016-12-21 1254 12/13/2016 0509  CALCIUM 11.2* 9.2    CBC Recent Labs  Lab 2016/12/21 1254 12/14/2016 0509  WBC 16.8* 24.3*  HGB 12.0 10.7*  HCT 36.1 32.6*  PLT 403* 325    Coag's No results for input(s): APTT, INR in the last 168 hours.  Sepsis Markers Recent Labs  Lab 12/21/2016 1641  LATICACIDVEN 2.16*    ABG No results for input(s): PHART, PCO2ART, PO2ART in the last 168 hours.  Liver Enzymes Recent Labs  Lab 12/21/16 1254 12/15/2016 0509  AST 61* 93*  ALT 30 35  ALKPHOS 106 71  BILITOT 0.7 0.5  ALBUMIN 3.4* 2.2*    Cardiac Enzymes No results for input(s): TROPONINI, PROBNP in the last 168 hours.  Glucose No results for input(s): GLUCAP in the last 168 hours.  Imaging Ct Abdomen Pelvis Wo Contrast  Result Date: 12-21-2016 CLINICAL DATA:  Abdominal distention and constipation EXAM: CT ABDOMEN AND PELVIS WITHOUT CONTRAST TECHNIQUE: Multidetector CT imaging of the abdomen and pelvis was performed following the standard protocol without IV contrast. Oral contrast was administered. COMPARISON:  Abdominal radiographs 12/21/16 FINDINGS: Lower chest: There is atelectatic change in the left base. Lung bases otherwise  are clear. There are foci of coronary artery calcification. There is a focal hiatal hernia which contains contrast. Hepatobiliary: No focal liver lesions are evident. Gallbladder is not appreciable on this study. There is no biliary duct dilatation. Pancreas: There is no pancreatic mass or inflammatory focus. Spleen: No splenic lesions are evident. Adrenals/Urinary Tract: There is adrenal hypertrophy bilaterally. Kidneys bilaterally show no evident mass or hydronephrosis on either side. There is no renal or ureteral calculus on either side. Urinary bladder is not well seen due to susceptibility artifact from total hip replacements. Urinary bladder wall does not appear appreciably thickened. Stomach/Bowel: There is marked distention of multiple loops of colon with evidence of a degree of sigmoid volvulus. The sigmoid is largely in the upper abdomen with marked distention. The twisting the mesenteric E is seen  along the more distal aspect of the of sigmoid in the midline region of the pelvis. There is no appreciable small bowel obstruction. No free air or portal venous air is evident. Vascular/Lymphatic: There is atherosclerotic calcification in the aorta and iliac arteries. Major mesenteric vessels appear patent, although there is moderate atherosclerotic calcification in major mesenteric vessels. No adenopathy is appreciable in the abdomen or pelvis. Reproductive: Several calcified uterine leiomyomas are noted in the pelvis, largest measuring 3.4 x 2.9 cm. No other pelvic masses are evident. Other: No periappendiceal region inflammation noted. No abscess or ascites evident in the abdomen or pelvis. 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 There is degenerative change throughout the lower thoracic and lumbar spine regions. There are no blastic or lytic bone lesions. There is no intramuscular or abdominal wall lesion. IMPRESSION: 1. Marked dilatation of most of the  colon with evidence of sigmoid volvulus. Twisted no mesenteric is seen in the midline at the junction of the mid the distal sigmoid. 2.  No small bowel obstruction.  No free air. 3. Hiatal hernia containing contrast, likely due to the distal obstruction from the sigmoid volvulus. 4. Aortoiliac atherosclerosis. No aneurysm evident. Coronary artery calcification also evident. 5. Adrenal hypertrophy bilaterally. Significance of this finding uncertain. 6.  No evident renal or ureteral calculus.  No hydronephrosis. 7.  Several calcified uterine leiomyomas. Aortic Atherosclerosis (ICD10-I70.0). Critical Value/emergent results were called by telephone at the time of interpretation on 12/18/2016 at 5:57 pm to [facility] , who verbally acknowledged these results. Electronically Signed   By: Bretta BangWilliam  Woodruff III M.D.   On: 12/11/2016 17:58   [place] Renal  Result Date: 12/29/2016 CLINICAL DATA:  Renal failure EXAM: RENAL / URINARY TRACT ULTRASOUND COMPLETE COMPARISON:  CT 12 5 18  FINDINGS: Right Kidney: Length: 8.9 cm.  Increase cortical echogenicity.  No hydronephrosis Left Kidney: Length: 10.9 cm. Increased cortical echogenicity. No hydronephrosis Bladder: Bladder collapsed post void. IMPRESSION: 1. Echogenic kidneys suggest medical renal disease. 2. No hydronephrosis. Electronically Signed   By: Genevive BiStewart  Edmunds M.D.   On: 12/31/2016 09:03   Dg Abd Acute W/chest  Result Date: 12/23/2016 CLINICAL DATA:  Generalized abdominal pain and distension associated with nausea and constipation for the past 4 days. No current chest symptoms. EXAM: DG ABDOMEN ACUTE W/ 1V CHEST COMPARISON:  No recent studies in PACs FINDINGS: There are severely distended bowel loops within the abdomen. One loop measures up to 13 cm in diameter. It is not possible to determine whether this is small or large-bowel though I favor large bowel. No definite free extraluminal gas collections are observed. The lungs are hypoinflated but clear. The heart  and pulmonary vascularity are normal. IMPRESSION: Severely distended bowel loops with air and fluid. No definite perforation. Abdominal and pelvic CT scanning is recommended. No acute cardiopulmonary abnormality. I cannot exclude a sigmoid volvulus in the appropriate clinical setting. Electronically Signed   By: David  SwazilandJordan M.D.   On: 12/27/2016 16:31   Dg Abd Portable 1v  Result Date: 01/08/2017 CLINICAL DATA:  Sigmoid volvulus.  Abdominal distension. EXAM: PORTABLE ABDOMEN - 1 VIEW COMPARISON:  CT scan of December 14, 2016 and abdominal radiographs of the same day. FINDINGS: There remain distended loops of predominantly colon throughout the abdomen. There is minimal gaseous distention of small-bowel loops to the left of midline. No definite free extraluminal gas collections are observed. There are calcifications within the pelvis consistent with fibroids. IMPRESSION: Persistent distention of the colon consistent  with a sigmoid volvulus. Possible slight interval decrease in the degree of distention since yesterday's study. Electronically Signed   By: David  [place] M.D.   On: 12/18/2016 11:15     STUDIES:  CT ABD/Pelvis 12/5 >> marked dilation of most of the colon with evidence of sigmoid volvulus, twisted no mesenteric is seen in the midline, no small bowel obstruction, no free air, hiatal hernia, aortoiliac atherosclerosis  CULTURES:   ANTIBIOTICS: Zosyn (empiric) 12/6 >>   SIGNIFICANT EVENTS: 12/05  Admit with abdominal pain 2/2 sigmoid volvulus, s/p EGD  LINES/TUBES: ETT 12/6 >>   DISCUSSION: 76 y/o F admitted with abdominal pain in the setting of sigmoid volvulus.  Underwent EGD for volvulus & decompression of colon.  Suffered cardiac arrest 12/6 with transfer to ICU.    ASSESSMENT / PLAN:  PULMONARY A: Acute Hypoxic Respiratory Failure - post cardiac arrest, ? Aspiration  R/O Aspiration PNA  P:   PRVC 8 cc/kg  Wean PEEP / FiO2 for sats > 92% CXR now and in am  ABG in one  hour   CARDIOVASCULAR A:  Shock - post arrest  Hx HTN  P:  ICU monitoring  Levophed for MAP > 65  Place central line for venous access / medications, emergent placement Assess EKG, troponin  Hold PO ASA, pravachol   RENAL A:   Hyponatremia  Hypokalemia  AKI  P:   Trend BMP / urinary output Replace electrolytes as indicated Avoid nephrotoxic agents, ensure adequate renal perfusion NS w KCL x 1 L then change to NS at 50 ml/hr   GASTROINTESTINAL A:   Abdominal Pain secondary to Sigmoid Volvulus s/p EGD P:   GI / CCS following  NPO  Insert OGT, LIS for decompression  Pepcid for SUP  HEMATOLOGIC A:   Anemia  Leukocytosis  P:  Trend CBC  Heparin SQ + SCD's for DVT prophylaxis   INFECTIOUS A:   Abdominal Pain  Possible Aspiration Event with CPR  Chronic R Hip Wound - on keflex prior to admit P:   Follow CXR Trend WBC / fever curve  Hold pre-admit Keflex  IV Zosyn for abdominal concerns / possible aspiration   ENDOCRINE A:   At Risk Hyper/Hypoglycemia  Hypothyroidism  P:   Monitor glucose  Continue synthroid change to IV dosing   NEUROLOGIC A:   Acute Metabolic Encephalopathy  P:   RASS goal: 0  PRN fentanyl / versed for pain / sedation  Frequent neuro exams    FAMILY  - Updates: Brother updated at bedside per Dr. Molli Knock.  - Inter-disciplinary family meet or Palliative Care meeting due by:  12/15    CC Time: 40 minutes   Canary Brim, NP-C  Pulmonary & Critical Care Pgr: [phone] or if no answer [phone] 12/29/2016, 1:19 PM  Attending Note:  76 year old female with extensive PMH who presents to PCCM after a cardiac arrest.  Patient had a volvulus that was decompressed that suffered a cardiac arrest on 12/6.  On exam, patient is completely unresponsive with a severely distended abdomen with diminished BS diffusely.  Discussed with PCCM-NP.  Cross table lateral CXR was done that showed severe distension in the colon.  I suspect  patient became so distended that developed respiratory failure and subsequent cardiac arrest.  Patient was resuscitated for 20 minutes and ROSC established but now patient is profoundly hypotensive.  Will place a TLC and measure CVP.  Will place a-line for more accurate BP measures.  CCS to  be contacted for ?perforation and they relayed that patient needs to be re-evaluated by GI.  Contacted Dr. Bosie ClosSchooler who will see the patient.  Called daughter Tunkhannock DesanctisKathryn Suarez [phone] with no response.  The patient is critically ill with multiple organ systems failure and requires high complexity decision making for assessment and support, frequent evaluation and titration of therapies, application of advanced monitoring technologies and extensive interpretation of multiple databases.   Critical Care Time devoted to patient care services described in this note is  100  Minutes. This time reflects time of care of this signee Dr Koren BoundWesam Hyrum Shaneyfelt. This critical care time does not reflect procedure time, or teaching time or supervisory time of PA/NP/Med student/Med Resident etc but could involve care discussion time.  Alyson ReedyWesam G. Margarito Dehaas, M.D. [facility]/Critical [facility] Medicine. Pager: [phone]. After hours pager: [phone].

## 2016-12-16 ENCOUNTER — Inpatient Hospital Stay (HOSPITAL_COMMUNITY): Payer: Medicare Other

## 2016-12-16 ENCOUNTER — Encounter (HOSPITAL_COMMUNITY): Payer: Self-pay | Admitting: Surgery

## 2016-12-16 DIAGNOSIS — A419 Sepsis, unspecified organism: Secondary | ICD-10-CM

## 2016-12-16 DIAGNOSIS — Z515 Encounter for palliative care: Secondary | ICD-10-CM

## 2016-12-16 DIAGNOSIS — R6521 Severe sepsis with septic shock: Secondary | ICD-10-CM

## 2016-12-16 LAB — CBC WITH DIFFERENTIAL/PLATELET
BAND NEUTROPHILS: 0 %
BASOS ABS: 0 10*3/uL (ref 0.0–0.1)
BASOS PCT: 0 %
Blasts: 0 %
EOS ABS: 0 10*3/uL (ref 0.0–0.7)
EOS PCT: 0 %
HCT: 30.8 % — ABNORMAL LOW (ref 36.0–46.0)
Hemoglobin: 9.5 g/dL — ABNORMAL LOW (ref 12.0–15.0)
Lymphocytes Relative: 25 %
Lymphs Abs: 2.6 10*3/uL (ref 0.7–4.0)
MCH: 23.6 pg — AB (ref 26.0–34.0)
MCHC: 30.8 g/dL (ref 30.0–36.0)
MCV: 76.6 fL — ABNORMAL LOW (ref 78.0–100.0)
MONO ABS: 0.4 10*3/uL (ref 0.1–1.0)
MYELOCYTES: 0 %
Metamyelocytes Relative: 0 %
Monocytes Relative: 4 %
NEUTROS ABS: 7.5 10*3/uL (ref 1.7–7.7)
Neutrophils Relative %: 71 %
PLATELETS: 186 10*3/uL (ref 150–400)
PROMYELOCYTES ABS: 0 %
RBC: 4.02 MIL/uL (ref 3.87–5.11)
RDW: 16.9 % — ABNORMAL HIGH (ref 11.5–15.5)
WBC Morphology: INCREASED
WBC: 10.5 10*3/uL (ref 4.0–10.5)
nRBC: 0 /100 WBC

## 2016-12-16 LAB — BASIC METABOLIC PANEL
Anion gap: 22 — ABNORMAL HIGH (ref 5–15)
BUN: 60 mg/dL — AB (ref 6–20)
CALCIUM: 6.5 mg/dL — AB (ref 8.9–10.3)
CHLORIDE: 95 mmol/L — AB (ref 101–111)
CO2: 11 mmol/L — ABNORMAL LOW (ref 22–32)
CREATININE: 3.56 mg/dL — AB (ref 0.44–1.00)
GFR, EST AFRICAN AMERICAN: 13 mL/min — AB (ref >60)
GFR, EST NON AFRICAN AMERICAN: 11 mL/min — AB (ref >60)
Glucose, Bld: 258 mg/dL — ABNORMAL HIGH (ref 65–99)
Potassium: 4.4 mmol/L (ref 3.5–5.1)
SODIUM: 128 mmol/L — AB (ref 135–145)

## 2016-12-16 LAB — HEPATITIS PANEL, ACUTE
HCV Ab: <0.1 s/co ratio (ref 0.0–0.9)
HEP A IGM: NEGATIVE
Hep B C IgM: NEGATIVE
Hepatitis B Surface Ag: NEGATIVE

## 2016-12-16 LAB — MAGNESIUM: Magnesium: 2.7 mg/dL — ABNORMAL HIGH (ref 1.7–2.4)

## 2016-12-16 LAB — TROPONIN I: TROPONIN I: 0.33 ng/mL — AB (ref <0.03)

## 2016-12-16 LAB — PHOSPHORUS: PHOSPHORUS: 7.3 mg/dL — AB (ref 2.5–4.6)

## 2016-12-16 LAB — GLUCOSE, CAPILLARY: Glucose-Capillary: 198 mg/dL — ABNORMAL HIGH (ref 65–99)

## 2016-12-16 MED ORDER — MORPHINE SULFATE (PF) 10 MG/ML IV SOLN: 10.0000 mg | Freq: Once | INTRAVENOUS | Status: DC

## 2016-12-16 MED ORDER — INSULIN ASPART 100 UNIT/ML ~~LOC~~ SOLN
0.0000 [IU] | SUBCUTANEOUS | Status: DC
  Administered 2016-12-16: 3 [IU] via SUBCUTANEOUS

## 2016-12-16 MED ORDER — MORPHINE SULFATE (PF) 2 MG/ML IV SOLN
2.0000 mg | Freq: Once | INTRAVENOUS | Status: AC
  Administered 2016-12-16: 2 mg via INTRAVENOUS
  Filled 2016-12-16: qty 1

## 2016-12-16 MED ORDER — MORPHINE SULFATE (PF) 4 MG/ML IV SOLN
8.0000 mg | Freq: Once | INTRAVENOUS | Status: AC
  Administered 2016-12-16: 8 mg via INTRAVENOUS
  Filled 2016-12-16: qty 2

## 2016-12-16 MED ORDER — SODIUM BICARBONATE 8.4 % IV SOLN
INTRAVENOUS | Status: DC
  Administered 2016-12-16 (×2): via INTRAVENOUS
  Filled 2016-12-16 (×3): qty 200

## 2016-12-17 LAB — POCT I-STAT 7, (LYTES, BLD GAS, ICA,H+H)
ACID-BASE DEFICIT: 12 mmol/L — AB (ref 0.0–2.0)
ACID-BASE DEFICIT: 8 mmol/L — AB (ref 0.0–2.0)
BICARBONATE: 20 mmol/L (ref 20.0–28.0)
Bicarbonate: 17.2 mmol/L — ABNORMAL LOW (ref 20.0–28.0)
CALCIUM ION: 1.03 mmol/L — AB (ref 1.15–1.40)
CALCIUM ION: 1 mmol/L — AB (ref 1.15–1.40)
HCT: 28 % — ABNORMAL LOW (ref 36.0–46.0)
HCT: 26 % — ABNORMAL LOW (ref 36.0–46.0)
Hemoglobin: 9.5 g/dL — ABNORMAL LOW (ref 12.0–15.0)
Hemoglobin: 8.8 g/dL — ABNORMAL LOW (ref 12.0–15.0)
O2 SAT: 79 %
O2 SAT: 85 %
PCO2 ART: 45.7 mmHg (ref 32.0–48.0)
PH ART: 7.159 — AB (ref 7.350–7.450)
PH ART: 7.229 — AB (ref 7.350–7.450)
PO2 ART: 49 mmHg — AB (ref 83.0–108.0)
Patient temperature: 33.6
Potassium: 4 mmol/L (ref 3.5–5.1)
Potassium: 4 mmol/L (ref 3.5–5.1)
SODIUM: 136 mmol/L (ref 135–145)
Sodium: 137 mmol/L (ref 135–145)
TCO2: 19 mmol/L — ABNORMAL LOW (ref 22–32)
TCO2: 22 mmol/L (ref 22–32)
pCO2 arterial: 46 mmHg (ref 32.0–48.0)
pO2, Arterial: 45 mmHg — ABNORMAL LOW (ref 83.0–108.0)

## 2016-12-21 ENCOUNTER — Telehealth: Payer: Self-pay

## 2016-12-21 NOTE — Telephone Encounter (Signed)
On 12/21/16 I received a d/c from [employer] (original). The d/c is for cremation. The patient is a patient of Doctor Molli KnockYacoub. [phone] for [facility].  On 12/22/16 I received the d/c back from Doctor Molli KnockYacoub. I got the d/c ready and called the funeral home to let them know the d/c is ready for pickup. I also faxed a copy to the funeral home per the funeral home request.

## 2017-01-10 NOTE — Progress Notes (Signed)
Initial Nutrition Assessment  DOCUMENTATION CODES:   Obesity unspecified  INTERVENTION:    If within scope of care, recommend nutrition support. If unable to begin enteral nutrition within 7-8 days, consider TPN. If able to begin TF, please consult RD for recommendations.  NUTRITION DIAGNOSIS:   Inadequate oral intake related to inability to eat as evidenced by NPO status.  GOAL:   Provide needs based on ASPEN/SCCM guidelines  MONITOR:   Vent status, Weight trends, Labs, I & O's  REASON FOR ASSESSMENT:   Ventilator    ASSESSMENT:   77 yo female with PMH of HTN, HLD, thyroid DZ, osteopenia, estrogen deficiency who was admitted on 12/5 with sigmoid volvulus. She had an ischemic event (high risk of ABI). S/P emergent ex lap on 12/7 with total abdominal colectomy on 12/6.  Discussed patient with RN today. Poor prognosis noted.  Patient is currently intubated on ventilator support MV: 18 L/min Temp (24hrs), Avg:96.5 F (35.8 C), Min:94.3 F (34.6 C), Max:97.7 F (36.5 C)   Labs reviewed. Sodium 128 (L), Phosphorus 7.3 (H), Magnesium 2.7 (H) Medications reviewed.  NUTRITION - FOCUSED PHYSICAL EXAM:  Unable to complete at this time  Diet Order:  Diet NPO time specified  EDUCATION NEEDS:   No education needs have been identified at this time  Skin:  Skin Assessment: Skin Integrity Issues: Skin Integrity Issues:: DTI, Incisions DTI: buttocks Incisions: abd wound with VAC  Last BM:  12/2  Height:   Ht Readings from Last 1 Encounters:  12/15/2016 5\' 8"  (1.727 m)    Weight:   Wt Readings from Last 1 Encounters:  01/03/2017 214 lb 1.1 oz (97.1 kg)    Ideal Body Weight:  63.6 kg  BMI:  Body mass index is 32.55 kg/m.  Estimated Nutritional Needs:   Kcal:  [id number]  Protein:  127 gm  Fluid:  1.5-2 L   Joaquin CourtsKimberly Urian Martenson, RD, LDN, CNSC Pager [phone] After Hours Pager [phone]

## 2017-01-10 NOTE — Progress Notes (Signed)
1 Day Post-Op   Subjective/Chief Complaint: Remains critically ill  On multiple pressors Not waking up Pupils fixed and dilated     Objective: Vital signs in last 24 hours: Temp:  [94.3 F (34.6 C)-97.3 F (36.3 C)] 97.3 F (36.3 C) (12/07 0900) Pulse Rate:  [79-107] 83 (12/07 0400) Resp:  [19-35] 35 (12/07 0900) BP: (61-124)/(41-87) 92/72 (12/07 0900) SpO2:  [83 %-96 %] 96 % (12/07 0800) Arterial Line BP: (69-144)/(35-88) 104/58 (12/07 0915) FiO2 (%):  [100 %] 100 % (12/07 0853) Last BM Date: 12/11/16  Intake/Output from previous day: 12/06 0701 - 12/07 0700 In: 5617.2 [I.V.:5447.2; IV Piggyback:150] Out: 690 [Urine:40; Drains:450; Blood:100] Intake/Output this shift: Total I/O In: 320.2 [I.V.:300.2; Other:20] Out: -   Incision/Wound:vac in place   Lab Results:  Recent Labs    12/23/16 0509 12/17/2016 0358  WBC 24.3* 10.5  HGB 10.7* 9.5*  HCT 32.6* 30.8*  PLT 325 186   BMET Recent Labs    12/23/16 0509 12/28/2016 0358  NA 131* 128*  K 3.3* 4.4  CL 98* 95*  CO2 18* 11*  GLUCOSE 75 258*  BUN 64* 60*  CREATININE 2.94* 3.56*  CALCIUM 9.2 6.5*   PT/INR No results for input(s): LABPROT, INR in the last 72 hours. ABG Recent Labs    2016/12/23 1838 12-23-16 2045  PHART 7.249* 7.210*  HCO3 17.3* 15.2*    Studies/Results: Ct Abdomen Pelvis Wo Contrast  Result Date: 12/27/2016 CLINICAL DATA:  Abdominal distention and constipation EXAM: CT ABDOMEN AND PELVIS WITHOUT CONTRAST TECHNIQUE: Multidetector CT imaging of the abdomen and pelvis was performed following the standard protocol without IV contrast. Oral contrast was administered. COMPARISON:  Abdominal radiographs December 14, 2016 FINDINGS: Lower chest: There is atelectatic change in the left base. Lung bases otherwise are clear. There are foci of coronary artery calcification. There is a focal hiatal hernia which contains contrast. Hepatobiliary: No focal liver lesions are evident. Gallbladder is not  appreciable on this study. There is no biliary duct dilatation. Pancreas: There is no pancreatic mass or inflammatory focus. Spleen: No splenic lesions are evident. Adrenals/Urinary Tract: There is adrenal hypertrophy bilaterally. Kidneys bilaterally show no evident mass or hydronephrosis on either side. There is no renal or ureteral calculus on either side. Urinary bladder is not well seen due to susceptibility artifact from total hip replacements. Urinary bladder wall does not appear appreciably thickened. Stomach/Bowel: There is marked distention of multiple loops of colon with evidence of a degree of sigmoid volvulus. The sigmoid is largely in the upper abdomen with marked distention. The twisting the mesenteric E is seen along the more distal aspect of the of sigmoid in the midline region of the pelvis. There is no appreciable small bowel obstruction. No free air or portal venous air is evident. Vascular/Lymphatic: There is atherosclerotic calcification in the aorta and iliac arteries. Major mesenteric vessels appear patent, although there is moderate atherosclerotic calcification in major mesenteric vessels. No adenopathy is appreciable in the abdomen or pelvis. Reproductive: Several calcified uterine leiomyomas are noted in the pelvis, largest measuring 3.4 x 2.9 cm. No other pelvic masses are evident. Other: No periappendiceal region inflammation noted. No abscess or ascites evident in the abdomen or pelvis. 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 There is degenerative change throughout the lower thoracic and lumbar spine regions. There are no blastic or lytic bone lesions. There is no intramuscular or abdominal wall lesion. IMPRESSION: 1. Marked dilatation of most of the colon with evidence  of sigmoid volvulus. Twisted no mesenteric is seen in the midline at the junction of the mid the distal sigmoid. 2.  No small bowel obstruction.  No free air. 3. Hiatal  hernia containing contrast, likely due to the distal obstruction from the sigmoid volvulus. 4. Aortoiliac atherosclerosis. No aneurysm evident. Coronary artery calcification also evident. 5. Adrenal hypertrophy bilaterally. Significance of this finding uncertain. 6.  No evident renal or ureteral calculus.  No hydronephrosis. 7.  Several calcified uterine leiomyomas. Aortic Atherosclerosis (ICD10-I70.0). Critical Value/emergent results were called by telephone at the time of interpretation on 12/13/2016 at 5:57 pm to [facility] , who verbally acknowledged these results. Electronically Signed   By: Bretta BangWilliam  Woodruff III M.D.   On: 12/10/2016 17:58   Dg Abd 1 View  Result Date: 12/20/2016 CLINICAL DATA:  Volvulus. EXAM: ABDOMEN - 1 VIEW COMPARISON:  Radiographs of same day. FINDINGS: Single cross-table lateral view of the abdomen demonstrates continued presence of severe colonic distention. Surgical sutures are noted. No pneumoperitoneum is noted. IMPRESSION: Severe colonic distention is noted concerning for obstruction. No definite pneumoperitoneum is noted. Electronically Signed   By: Lupita RaiderJames  Green Jr, M.D.   On: 12/13/2016 13:40   [place] Renal  Result Date: 01/04/2017 CLINICAL DATA:  Renal failure EXAM: RENAL / URINARY TRACT ULTRASOUND COMPLETE COMPARISON:  CT 12 5 18  FINDINGS: Right Kidney: Length: 8.9 cm.  Increase cortical echogenicity.  No hydronephrosis Left Kidney: Length: 10.9 cm. Increased cortical echogenicity. No hydronephrosis Bladder: Bladder collapsed post void. IMPRESSION: 1. Echogenic kidneys suggest medical renal disease. 2. No hydronephrosis. Electronically Signed   By: Genevive BiStewart  Edmunds M.D.   On: 01/09/2017 09:03   Dg Chest Port 1 View  Result Date: 12/31/2016 CLINICAL DATA:  Endotracheal tube placement. EXAM: PORTABLE CHEST 1 VIEW COMPARISON:  Radiograph December 15, 2016. FINDINGS: The heart size and mediastinal contours are within normal limits. Endotracheal and nasogastric tubes are in  good position. Right internal jugular catheter is unchanged in position. Increased bilateral perihilar airspace opacities are noted concerning for pneumonia. No pneumothorax or pleural effusion is noted. Severe degenerative changes seen involving the left glenohumeral joint. IMPRESSION: Stable support apparatus. Increased bilateral perihilar airspace opacities are noted concerning for pneumonia. Electronically Signed   By: Lupita RaiderJames  Green Jr, M.D.   On: 05-08-16 07:55   Dg Chest Port 1 View  Result Date: 12/19/2016 CLINICAL DATA:  Central line placement EXAM: PORTABLE CHEST 1 VIEW COMPARISON:  12/10/2016 FINDINGS: Endotracheal tube 2 cm above the carina. Right jugular central venous catheter tip in the lower SVC. No pneumothorax. NG tube in the stomach. Left lower lobe airspace disease unchanged.  No effusion or edema. Large amount of gas below the diaphragm is unchanged from earlier today and yesterday. No free air was identified on decubitus view of the abdomen yesterday. IMPRESSION: Central line in the lower SVC without pneumothorax. Endotracheal tube remains in good position Left lower lobe airspace disease unchanged, possible pneumonia Marked gaseous distention below the diaphragm presumably dilated colon unchanged from earlier today and yesterday. Electronically Signed   By: Marlan Palauharles  Clark M.D.   On: 12/27/2016 14:55   Dg Chest Port 1 View  Result Date: 12/26/2016 CLINICAL DATA:  Low O2 saturations. Code. Sigmoid volvulus. Recent colonic decompression. EXAM: PORTABLE CHEST 1 VIEW COMPARISON:  12/26/2016. FINDINGS: Marked gaseous distention is incidentally noted, similar to priors. Normal heart size. No consolidation or edema. Endotracheal tube has been inserted, and appears to lie 2 cm above the carina. There is no pneumothorax.  An enteric tube appears to be coiled within the stomach. IMPRESSION: ET tube and nasogastric tube as described. No consolidation or edema. Marked gaseous distention,  incompletely evaluated. Electronically Signed   By: Elsie StainJohn T Curnes M.D.   On: 01/07/2017 13:39   Dg Abd Acute W/chest  Result Date: 12/28/2016 CLINICAL DATA:  Generalized abdominal pain and distension associated with nausea and constipation for the past 4 days. No current chest symptoms. EXAM: DG ABDOMEN ACUTE W/ 1V CHEST COMPARISON:  No recent studies in PACs FINDINGS: There are severely distended bowel loops within the abdomen. One loop measures up to 13 cm in diameter. It is not possible to determine whether this is small or large-bowel though I favor large bowel. No definite free extraluminal gas collections are observed. The lungs are hypoinflated but clear. The heart and pulmonary vascularity are normal. IMPRESSION: Severely distended bowel loops with air and fluid. No definite perforation. Abdominal and pelvic CT scanning is recommended. No acute cardiopulmonary abnormality. I cannot exclude a sigmoid volvulus in the appropriate clinical setting. Electronically Signed   By: David  SwazilandJordan M.D.   On: 12/13/2016 16:31   Dg Abd Portable 1v  Result Date: 12/27/2016 CLINICAL DATA:  Sigmoid volvulus.  Abdominal distension. EXAM: PORTABLE ABDOMEN - 1 VIEW COMPARISON:  CT scan of December 14, 2016 and abdominal radiographs of the same day. FINDINGS: There remain distended loops of predominantly colon throughout the abdomen. There is minimal gaseous distention of small-bowel loops to the left of midline. No definite free extraluminal gas collections are observed. There are calcifications within the pelvis consistent with fibroids. IMPRESSION: Persistent distention of the colon consistent with a sigmoid volvulus. Possible slight interval decrease in the degree of distention since yesterday's study. Electronically Signed   By: David  [place]Jordan M.D.   On: 01/03/2017 11:15    Anti-infectives: Anti-infectives (From admission, onward)   Start     Dose/Rate Route Frequency Ordered Stop   March 31, 2016 2000  anidulafungin  (ERAXIS) 100 mg in sodium chloride 0.9 % 100 mL IVPB     100 mg 78 mL/hr over 100 Minutes Intravenous Every 24 hours 01/09/2017 1943     March 31, 2016 0600  cefoTEtan (CEFOTAN) 2 g in dextrose 5 % 50 mL IVPB     2 g 100 mL/hr over 30 Minutes Intravenous On call to O.R. 12/20/2016 1752 12/17/16 0559   12/18/2016 2030  anidulafungin (ERAXIS) 200 mg in sodium chloride 0.9 % 200 mL IVPB     200 mg 78 mL/hr over 200 Minutes Intravenous  Once 12/18/2016 1943 12/17/2016 2355   12/11/2016 2015  vancomycin (VANCOCIN) 2,000 mg in sodium chloride 0.9 % 500 mL IVPB     2,000 mg 250 mL/hr over 120 Minutes Intravenous  Once 01/05/2017 2001 12/11/2016 2215   12/24/2016 1515  cefoTEtan (CEFOTAN) 2 g in dextrose 5 % 50 mL IVPB     2 g 100 mL/hr over 30 Minutes Intravenous To Surgery 01/08/2017 1504 01/01/2017 1531   01/01/2017 1430  piperacillin-tazobactam (ZOSYN) IVPB 2.25 g     2.25 g 100 mL/hr over 30 Minutes Intravenous Every 6 hours 12/13/2016 1355     01/04/2017 2215  cephALEXin (KEFLEX) capsule 750 mg  Status:  Discontinued     750 mg Oral 2 times daily 01/08/2017 2206 12/26/2016 1349      Assessment/Plan: s/p Procedure(s): Total abdominal colectomy for Sigmoid Volvulus (N/A) She had ischemic event and high risk of anoxic brain injury She will need vac change Saturday or Sunday but this  will depend on how she does May need head CT or evaluation for brain death  D/W brother at bedside   LOS: 2 days    Clovis Pu Gredmarie Delange Jan 12, 2017

## 2017-01-10 NOTE — Progress Notes (Signed)
Spoke with son and the rest of the family.  After a long discussion regarding the case and lack of improvement decision was made that the patient would not want this level of care specially that she is not responding and is deteriorating.  Will give patient 10 mg of IV morphine then will d/c all pressors.  I do not think putting the patient through extubation is necessary, in order to minimize pan and suffering.  Will make a full DNR per family discussion.  The patient is critically ill with multiple organ systems failure and requires high complexity decision making for assessment and support, frequent evaluation and titration of therapies, application of advanced monitoring technologies and extensive interpretation of multiple databases.   Critical Care Time devoted to patient care services described in this note is  60  Minutes. This time reflects time of care of this signee Dr Koren BoundWesam Yacoub. This critical care time does not reflect procedure time, or teaching time or supervisory time of PA/NP/Med student/Med Resident etc but could involve care discussion time.  Alyson ReedyWesam G. Yacoub, M.D. [facility]. Pager: [phone]. After hours pager: [phone].

## 2017-01-10 NOTE — Discharge Summary (Signed)
Erica Suarez, Erica             ACCOUNT NO.:  [id number]  MEDICAL RECORD NO.:  [id number]  LOCATION:  2M03C                        FACILITY:  [facility]  PHYSICIAN:  Felipa EvenerWesam Jake Yacoub, MD  DATE OF BIRTH:  [birth year]  DATE OF ADMISSION:  07-27-2016 DATE OF DISCHARGE:  12/10/2016                              DISCHARGE SUMMARY   DEATH SUMMARY:  PRIMARY DIAGNOSIS/CAUSE OF DEATH:  Pulseless activity cardiac arrest.  SECONDARY DIAGNOSES:  Acute hypoxemic respiratory failure, aspiration, circulatory shock, hyponatremia, hypokalemia, acute kidney injury, abdominal pain, sigmoid volvulus, anemia, leukocytosis, hypothyroidism, metabolic encephalopathy.  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  The patient on the day of her dying had severe distention in the abdomen and was profoundly hypotensive, not responding to pressors.  The patient was stabilized somewhat, was sent to the OR for total colectomy.  Upon returning from the OR, the patient was profoundly hypotensive and failed to make any progress hemodynamically and then suffered pulseless activity cardiac arrest. The patient was resuscitated.  However, we had a conversation with the family, informed that the patient was not really making any significant progress.  If anything, she is deteriorating and becoming refractory, nothing further can be added.  The family reported the patient would not want that level of care and would rather be comfortable at which point 10 mg of IV morphine was given and the pressors were stopped to minimize trauma to the patient by extubating her.  The patient expired shortly thereafter, the family at bedside.     Felipa EvenerWesam Jake Yacoub, MD     WJY/MEDQ  D:  12/21/2016  T:  12/21/2016  Job:  [id number]

## 2017-01-10 NOTE — Consult Note (Signed)
PULMONARY / CRITICAL CARE MEDICINE   Name: Erica Suarez MRN: [id number] DOB: 07-Mar-1940    ADMISSION DATE:  01/01/2017 CONSULTATION DATE:  12/10/2016  REFERRING MD:  Dr. Hanley BenAlekh  CHIEF COMPLAINT:  Cardiac Arrest   HISTORY OF PRESENT ILLNESS:   77 y/o F who [facility] with a 48 hour history acutely worsening abdominal pain.    Per chart review, the patient reported she felt her medication changes were causing her abdominal pain.  She had noted constipation as well.  She had been having discomfort for approximately 2 weeks.  Her last BM was 4 days prior to admit. Initial evaluation included a CT of the Chest which showed evidence of colonic dilatation and a sigmoid volvulus.  [facility]  She was evaluated by GI and CCS. The patient underwent emergent sigmoidoscopy for the sigmoid volvulus & decompression of the colon.  The patient had AKI and surgery was planned for the weekend or Monday when her renal function improved.  The patient unfortunately suffered a cardiac arrest on 12/6 afternoon.  She required ACLS with 3 epinephrine, 1 sodium bicarbonate and 2 gm magnesium during the resuscitative efforts.  The patient was transferred to the ICU for further stabilization.    SUBJECTIVE: No events overnight, completely unresponsive this AM  VITAL SIGNS: BP 92/72   Pulse 83   Temp (!) 97.3 F (36.3 C)   Resp (!) 35   Ht 5\' 8"  (1.727 m)   Wt 97.1 kg (214 lb 1.1 oz)   SpO2 96%   BMI 32.55 kg/m   HEMODYNAMICS: CVP:  [12 mmHg] 12 mmHg  VENTILATOR SETTINGS: Vent Mode: PRVC FiO2 (%):  [100 %] 100 % Set Rate:  [18 bmp-35 bmp] 35 bmp Vt Set:  [510 mL-540 mL] 510 mL PEEP:  [5 cmH20-14 cmH20] 14 cmH20 Plateau Pressure:  [33 cmH20-34 cmH20] 33 cmH20  INTAKE / OUTPUT: I/O last 3 completed shifts: In: 7307.2 [P.O.:120; I.V.:7017.2; Other:20; IV Piggyback:150] Out: 690 [Urine:40; Drains:450; Other:100; Blood:100]  PHYSICAL  EXAMINATION: General: Chronically ill appearing elderly female, critically ill on vent   HEENT: [place]/AT, pupils are fixed and dilated, no EOM, MMM Neuro: Unresponsive off sedation, does not withdraw to pain CV: RRR, Nl S1/S2 and -M/R/G PULM: Coarse BS diffusely GI: Soft, NT, ND and +BS Extremities: warm/dry, no edema  Skin: no rashes or lesions  LABS:  BMET Recent Labs  Lab 12/23/2016 1254 12/24/2016 2230 01/06/2017 0509 09-13-2016 0358  NA 127*  --  131* 128*  K 2.4*  --  3.3* 4.4  CL 88*  --  98* 95*  CO2 20*  --  18* 11*  BUN 56*  --  64* 60*  CREATININE 2.34* 2.48* 2.94* 3.56*  GLUCOSE 139*  --  75 258*   Electrolytes Recent Labs  Lab 01/07/2017 1254 12/29/2016 0509 09-13-2016 0358  CALCIUM 11.2* 9.2 6.5*  MG  --   --  2.7*  PHOS  --   --  7.3*   CBC Recent Labs  Lab 01/08/2017 1254 12/25/2016 0509 09-13-2016 0358  WBC 16.8* 24.3* 10.5  HGB 12.0 10.7* 9.5*  HCT 36.1 32.6* 30.8*  PLT 403* 325 186   Coag's No results for input(s): APTT, INR in the last 168 hours.  Sepsis Markers Recent Labs  Lab 12/24/2016 1641  LATICACIDVEN 2.16*   ABG Recent Labs  Lab 12/12/2016 1411 12/28/2016 1838 12/13/2016 2045  PHART 7.111* 7.249* 7.210*  PCO2ART 38.7 38.3  37.8  PO2ART 65.0* 45.0* 61.0*   Liver Enzymes Recent Labs  Lab 2016-05-03 1254 12/21/2016 0509  AST 61* 93*  ALT 30 35  ALKPHOS 106 71  BILITOT 0.7 0.5  ALBUMIN 3.4* 2.2*   Cardiac Enzymes Recent Labs  Lab 01/01/2017 1434 12/25/2016 1834 12/25/2016 0358  TROPONINI 0.09* 0.14* 0.33*   Glucose No results for input(s): GLUCAP in the last 168 hours.  Imaging Dg Abd 1 View  Result Date: 12/19/2016 CLINICAL DATA:  Volvulus. EXAM: ABDOMEN - 1 VIEW COMPARISON:  Radiographs of same day. FINDINGS: Single cross-table lateral view of the abdomen demonstrates continued presence of severe colonic distention. Surgical sutures are noted. No pneumoperitoneum is noted. IMPRESSION: Severe colonic distention is noted concerning for  obstruction. No definite pneumoperitoneum is noted. Electronically Signed   By: Lupita RaiderJames  Green Jr, M.D.   On: 01/02/2017 13:40   Dg Chest Port 1 View  Result Date: 01/04/2017 CLINICAL DATA:  Endotracheal tube placement. EXAM: PORTABLE CHEST 1 VIEW COMPARISON:  Radiograph December 15, 2016. FINDINGS: The heart size and mediastinal contours are within normal limits. Endotracheal and nasogastric tubes are in good position. Right internal jugular catheter is unchanged in position. Increased bilateral perihilar airspace opacities are noted concerning for pneumonia. No pneumothorax or pleural effusion is noted. Severe degenerative changes seen involving the left glenohumeral joint. IMPRESSION: Stable support apparatus. Increased bilateral perihilar airspace opacities are noted concerning for pneumonia. Electronically Signed   By: Lupita RaiderJames  Green Jr, M.D.   On: 12/10/2016 07:55   Dg Chest Port 1 View  Result Date: 12/29/2016 CLINICAL DATA:  Central line placement EXAM: PORTABLE CHEST 1 VIEW COMPARISON:  12/29/2016 FINDINGS: Endotracheal tube 2 cm above the carina. Right jugular central venous catheter tip in the lower SVC. No pneumothorax. NG tube in the stomach. Left lower lobe airspace disease unchanged.  No effusion or edema. Large amount of gas below the diaphragm is unchanged from earlier today and yesterday. No free air was identified on decubitus view of the abdomen yesterday. IMPRESSION: Central line in the lower SVC without pneumothorax. Endotracheal tube remains in good position Left lower lobe airspace disease unchanged, possible pneumonia Marked gaseous distention below the diaphragm presumably dilated colon unchanged from earlier today and yesterday. Electronically Signed   By: Marlan Palauharles  Clark M.D.   On: 12/31/2016 14:55   Dg Chest Port 1 View  Result Date: 01/04/2017 CLINICAL DATA:  Low O2 saturations. Code. Sigmoid volvulus. Recent colonic decompression. EXAM: PORTABLE CHEST 1 VIEW COMPARISON:   2016/09/16. FINDINGS: Marked gaseous distention is incidentally noted, similar to priors. Normal heart size. No consolidation or edema. Endotracheal tube has been inserted, and appears to lie 2 cm above the carina. There is no pneumothorax. An enteric tube appears to be coiled within the stomach. IMPRESSION: ET tube and nasogastric tube as described. No consolidation or edema. Marked gaseous distention, incompletely evaluated. Electronically Signed   By: Elsie StainJohn T Curnes M.D.   On: 01/01/2017 13:39   Dg Abd Portable 1v  Result Date: 12/18/2016 CLINICAL DATA:  Sigmoid volvulus.  Abdominal distension. EXAM: PORTABLE ABDOMEN - 1 VIEW COMPARISON:  CT scan of December 14, 2016 and abdominal radiographs of the same day. FINDINGS: There remain distended loops of predominantly colon throughout the abdomen. There is minimal gaseous distention of small-bowel loops to the left of midline. No definite free extraluminal gas collections are observed. There are calcifications within the pelvis consistent with fibroids. IMPRESSION: Persistent distention of the colon consistent with a sigmoid volvulus. Possible slight interval decrease  in the degree of distention since yesterday's study. Electronically Signed   By: David  [place] M.D.   On: 01/06/2017 11:15   STUDIES:  CT ABD/Pelvis 12/5 >> marked dilation of most of the colon with evidence of sigmoid volvulus, twisted no mesenteric is seen in the midline, no small bowel obstruction, no free air, hiatal hernia, aortoiliac atherosclerosis  CULTURES:   ANTIBIOTICS: Zosyn (empiric) 12/6 >>   SIGNIFICANT EVENTS: 12/05  Admit with abdominal pain 2/2 sigmoid volvulus, s/p EGD  LINES/TUBES: ETT 12/6 >>> R IJ TLC 12/6>>>  DISCUSSION: 77 y/o F admitted with abdominal pain in the setting of sigmoid volvulus.  Underwent EGD for volvulus & decompression of colon.  Suffered cardiac arrest 12/6 with transfer to ICU.    ASSESSMENT / PLAN:  PULMONARY A: Acute Hypoxic  Respiratory Failure - post cardiac arrest, ? Aspiration  R/O Aspiration PNA  P:   Continue full vent support Spoke with brother and brother in Social worker (a urologist in [place]) stressed need for comfort care at this point as patient is in refractory shock and severe ARDS. CXR in am   CARDIOVASCULAR A:  Shock - post arrest  Hx HTN  P:  ICU monitoring  Levophed, epinephrine, dopamine and vasopressin with SBP in the 80's Discussing DNR status  RENAL A:   Hyponatremia  Hypokalemia  AKI  Aneuric P:   Trend BMP / urinary output Replace electrolytes as indicated Avoid nephrotoxic agents, ensure adequate renal perfusion Bicarb drip  GASTROINTESTINAL A:   Abdominal Pain secondary to Sigmoid Volvulus s/p EGD P:   GI / CCS following  NPO, no TF Insert OGT, LIS for decompression  Pepcid for SUP  HEMATOLOGIC A:   Anemia  Leukocytosis  P:  Trend CBC  Heparin SQ + SCD's for DVT prophylaxis   INFECTIOUS A:   Abdominal Pain  Possible Aspiration Event with CPR  Chronic R Hip Wound - on keflex prior to admit P:   Follow CXR Trend WBC / fever curve  Hold pre-admit Keflex  IV Zosyn for abdominal concerns / possible aspiration   ENDOCRINE A:   At Risk Hyper/Hypoglycemia  Hypothyroidism  P:   Monitor glucose  Continue synthroid change to IV dosing   NEUROLOGIC A:   Acute Metabolic Encephalopathy  P:   RASS goal: 0  PRN fentanyl / versed for pain / sedation  Frequent neuro exams   FAMILY  - Updates: Spoke with brother bedside, patient is not doing well, recommend DNR status, family is discussing and son is on his way over here will arrive between 1 and 2 PM then will discuss further.  - Inter-disciplinary family meet or Palliative Care meeting due by:  12/15   The patient is critically ill with multiple organ systems failure and requires high complexity decision making for assessment and support, frequent evaluation and titration of therapies, application of advanced  monitoring technologies and extensive interpretation of multiple databases.   Critical Care Time devoted to patient care services described in this note is  100  Minutes. This time reflects time of care of this signee Dr Koren Bound. This critical care time does not reflect procedure time, or teaching time or supervisory time of PA/NP/Med student/Med Resident etc but could involve care discussion time.  Alyson Reedy, M.D. [facility] Pulmonary/Critical Care Medicine. Pager: [phone]. After hours pager: [phone].

## 2017-01-10 NOTE — Progress Notes (Signed)
eLink Physician-Brief Progress Note Patient Name: Hinton LovelyMargaret E Jolin DOB: 1940-03-05 MRN: [id number]   Date of Service  01/04/2017  HPI/Events of Note  Notified by bedside nurse of acute worsening in hypotension. Previously SvO2 improved with the addition of dopamine to regimen. Now maxed out on multiple vasopressor agents.   eICU Interventions  1. Increasing upper limits for vasopressin  2. Increasing bicarbonate infusion at 200 mL per hour 3. Attempted to contact patient's daughter and brother. No answer. Voice message left to contact the ICU with brothers voice mail.      Intervention Category Major Interventions: Shock - evaluation and management;Sepsis - evaluation and management;Acid-Base disturbance - evaluation and management  Lawanda CousinsJennings Angelena Sand 12/10/2016, 1:09 AM

## 2017-01-10 NOTE — Progress Notes (Signed)
After being placed on comfort care measures patient went asystole at 1700.  No heart tones or breath sounds auscultated.  Family at bedside. CDS and MD and notified, lines and tubes removed per protocol and post mortem checklist complete.

## 2017-01-10 NOTE — Progress Notes (Signed)
eLink Physician-Brief Progress Note Patient Name: Erica Suarez DOB: 1940-09-12 MRN: [id number]   Date of Service  12/15/2016  HPI/Events of Note  Finally was able to reach patient's brother via phone. Updated regarding her critical status with multisystem organ failure. Son is arriving via airline at noon.   eICU Interventions  Recommended he contact any family who wish to be present and she may not survive.      Intervention Category Minor Interventions: Communication with other healthcare providers and/or family  [name] 12/21/2016, 6:16 AM

## 2017-01-10 DEATH — deceased

## 2019-05-16 IMAGING — CT CT ABD-PELV W/O CM
2 of 4 series · 15 of 46 positions shown, 17 images · non-contrast
Comparison: Abdominal radiographs December 14, 2016

ADDENDUM:
In the text, sentence should read, Twisting of the mesentery is seen
along the more distal aspect of the sigmoid in the midline region of
the pelvis. In the Impression, sentence should read: Mesenteric
twisting is seen in the midline at the junction of the mid and
distal sigmoid.
CLINICAL DATA: Abdominal distention and constipation

EXAM:
CT ABDOMEN AND PELVIS WITHOUT CONTRAST
TECHNIQUE: Multidetector CT imaging of the abdomen and pelvis was performed
following the standard protocol without IV contrast. Oral contrast
was administered.

[Series 3: ap without · axial · non-contrast · 0.91mm/px · z∈[+722,+1172]mm · 12 of 102 slices shown, 14 images]
[im 6/102  soft-tissue]
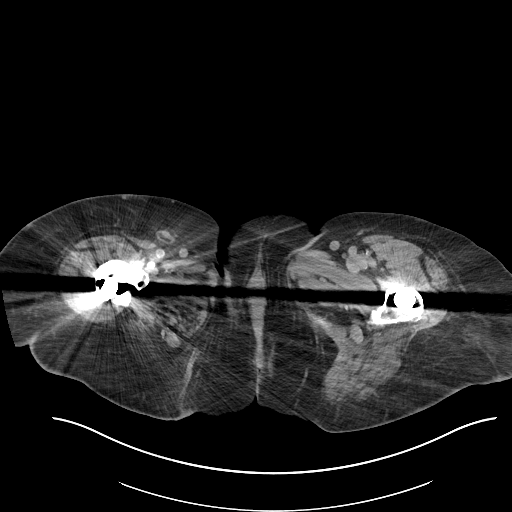
[im 6/102  bone]
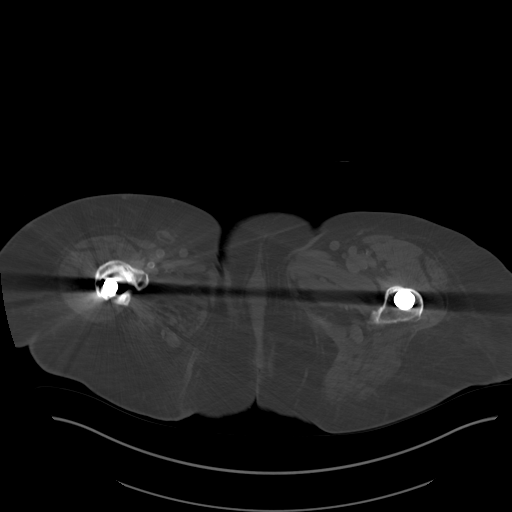
[im 16/102  soft-tissue]
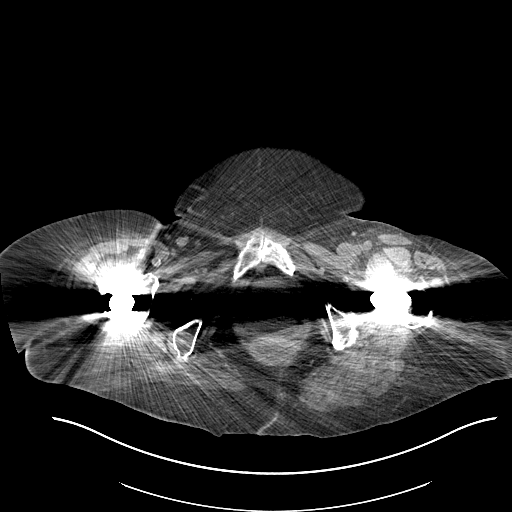
[im 21/102  soft-tissue]
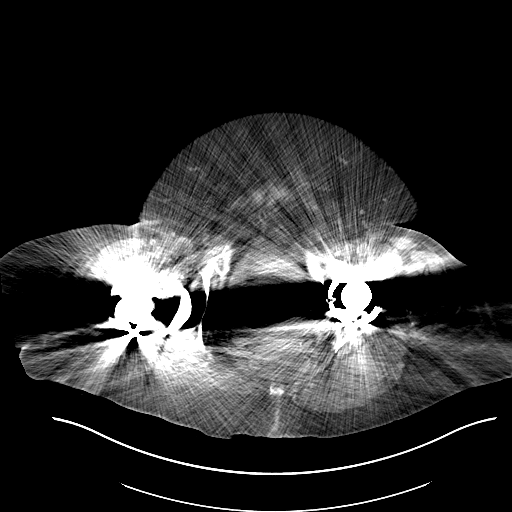
[im 31/102  soft-tissue]
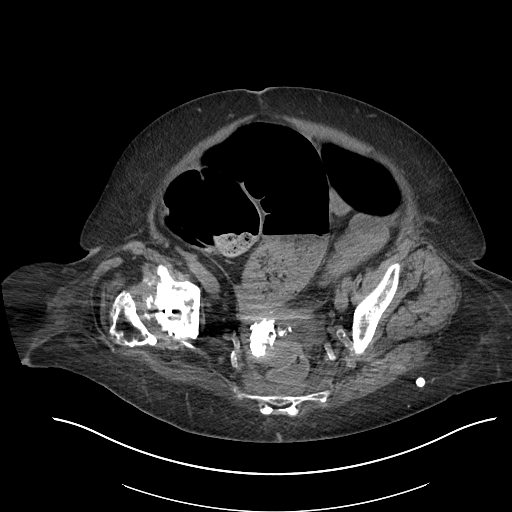
[im 41/102  soft-tissue]
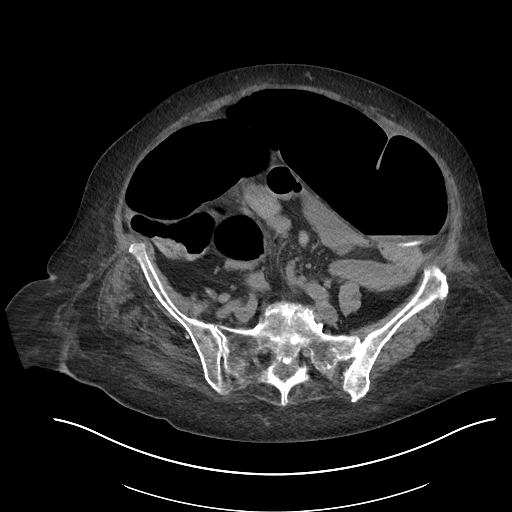
[im 46/102  soft-tissue]
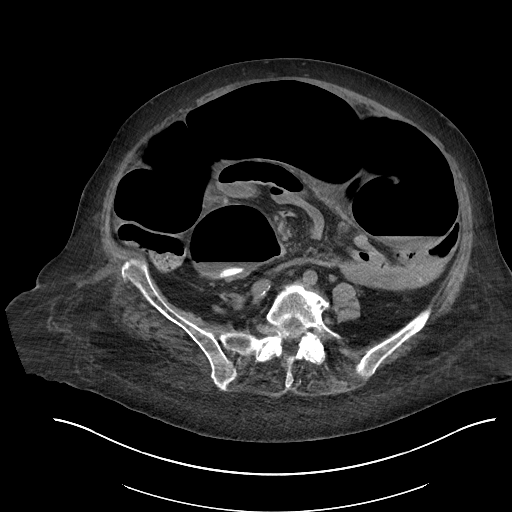
[im 56/102  soft-tissue]
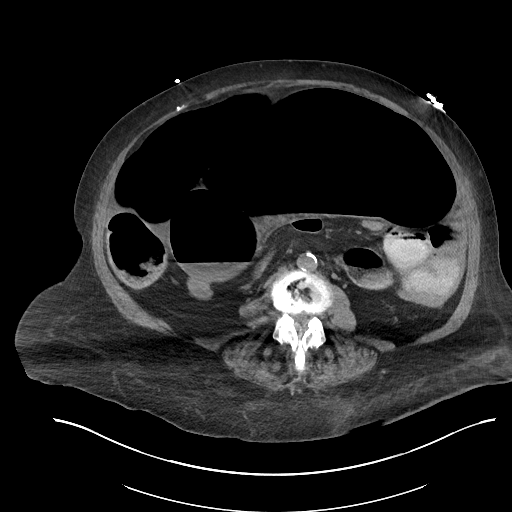
[im 61/102  soft-tissue]
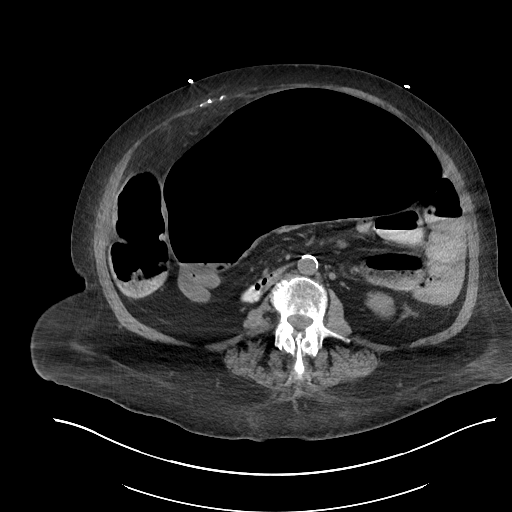
[im 71/102  soft-tissue]
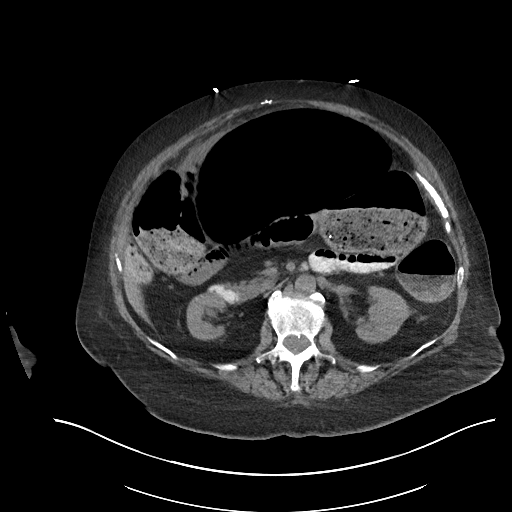
[im 71/102  bone]
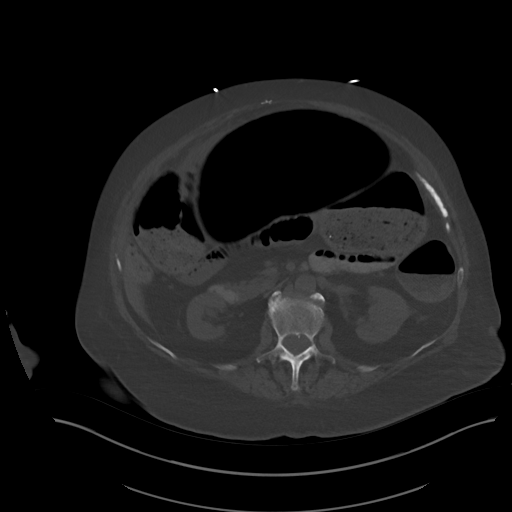
[im 81/102  soft-tissue]
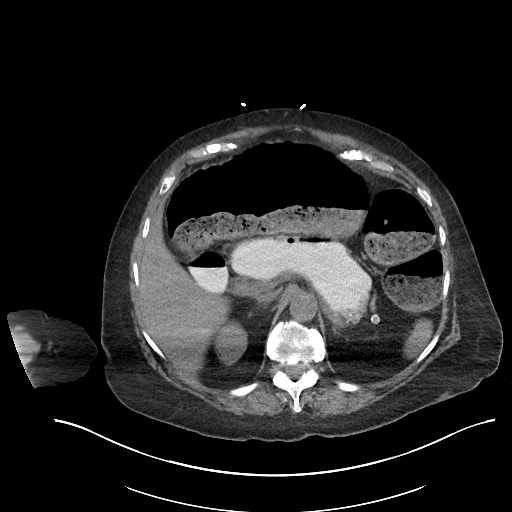
[im 86/102  soft-tissue]
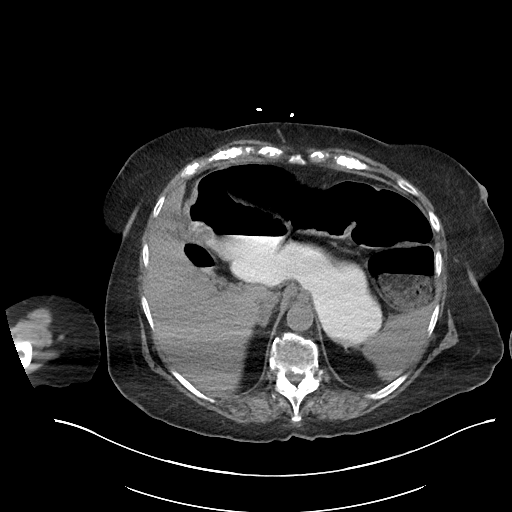
[im 96/102  soft-tissue]
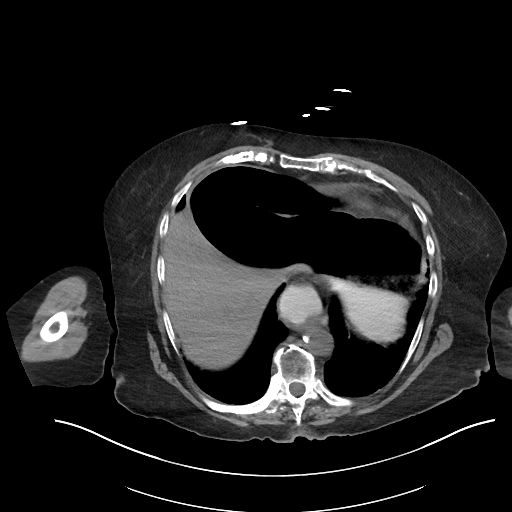

[Series 6: cor · coronal · 0.97mm/px · 3 of 114 slices shown]
[im 38/114  soft-tissue]
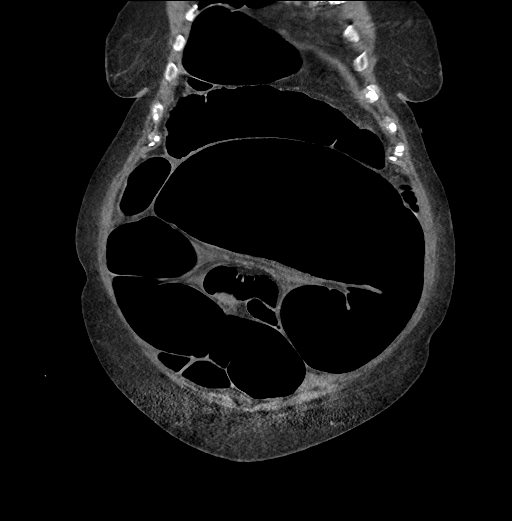
[im 51/114  soft-tissue]
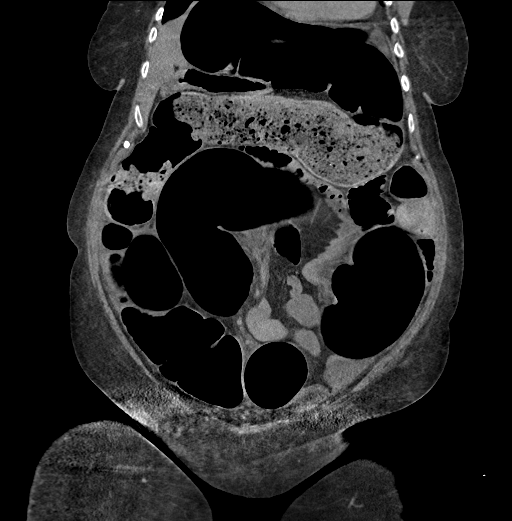
[im 63/114  soft-tissue]
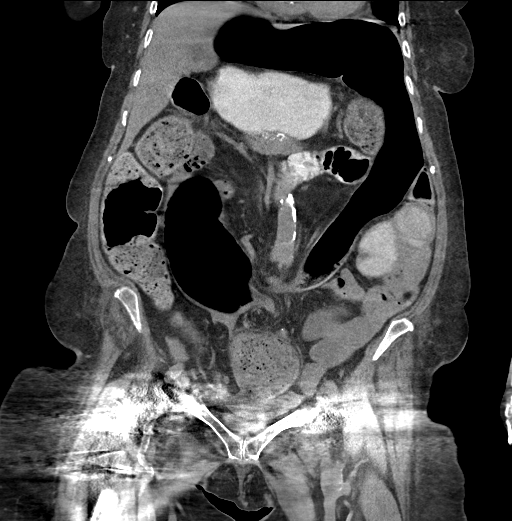

[15 of 46 positions shown; findings below may reference images not displayed]

FINDINGS: Lower chest: There is atelectatic change in the left base. Lung
bases otherwise are clear. There are foci of coronary artery
calcification. There is a focal hiatal hernia which contains
contrast.

Hepatobiliary: No focal liver lesions are evident. Gallbladder is
not appreciable on this study. There is no biliary duct dilatation.

Pancreas: There is no pancreatic mass or inflammatory focus.

Spleen: No splenic lesions are evident.

Adrenals/Urinary Tract: There is adrenal hypertrophy bilaterally.
Kidneys bilaterally show no evident mass or hydronephrosis on either
side. There is no renal or ureteral calculus on either side. Urinary
bladder is not well seen due to susceptibility artifact from total
hip replacements. Urinary bladder wall does not appear appreciably
thickened.

Stomach/Bowel: There is marked distention of multiple loops of colon
with evidence of a degree of sigmoid volvulus. The sigmoid is
largely in the upper abdomen with marked distention. The twisting
the mesenteric E is seen along the more distal aspect of the of
sigmoid in the midline region of the pelvis. There is no appreciable
small bowel obstruction. No free air or portal venous air is
evident.

Vascular/Lymphatic: There is atherosclerotic calcification in the
aorta and iliac arteries. Major mesenteric vessels appear patent,
although there is moderate atherosclerotic calcification in major
mesenteric vessels. No adenopathy is appreciable in the abdomen or
pelvis.

Reproductive: Several calcified uterine leiomyomas are noted in the
pelvis, largest measuring 3.4 x 2.9 cm. No other pelvic masses are
evident.

Other: No periappendiceal region inflammation noted. No abscess or
ascites evident in the abdomen or pelvis.

Musculoskeletal: Total hip replacements noted bilaterally. There is
lumbar levoscoliosis. There is degenerative change throughout the
lower thoracic and lumbar spine regions. There are no blastic or
lytic bone lesions. There is no intramuscular or abdominal wall
lesion.
IMPRESSION: 1. Marked dilatation of most of the colon with evidence of sigmoid
volvulus. Twisted no mesenteric is seen in the midline at the
junction of the mid the distal sigmoid.

2.  No small bowel obstruction.  No free air.

3. Hiatal hernia containing contrast, likely due to the distal
obstruction from the sigmoid volvulus.

4. Aortoiliac atherosclerosis. No aneurysm evident. Coronary artery
calcification also evident.

5. Adrenal hypertrophy bilaterally. Significance of this finding
uncertain.

6.  No evident renal or ureteral calculus.  No hydronephrosis.

7.  Several calcified uterine leiomyomas.

Aortic Atherosclerosis ([id number]).

Critical Value/emergent results were called by telephone at the time
of interpretation on 12/14/2016 at [DATE] to KAGERO TATB, PA , who
verbally acknowledged these results.

## 2019-05-17 IMAGING — CR DG CHEST 1V PORT
1 series · 1 of 1 positions shown · non-contrast
Comparison: 12/15/2016

CLINICAL DATA: Central line placement

EXAM:
PORTABLE CHEST 1 VIEW

[AP]
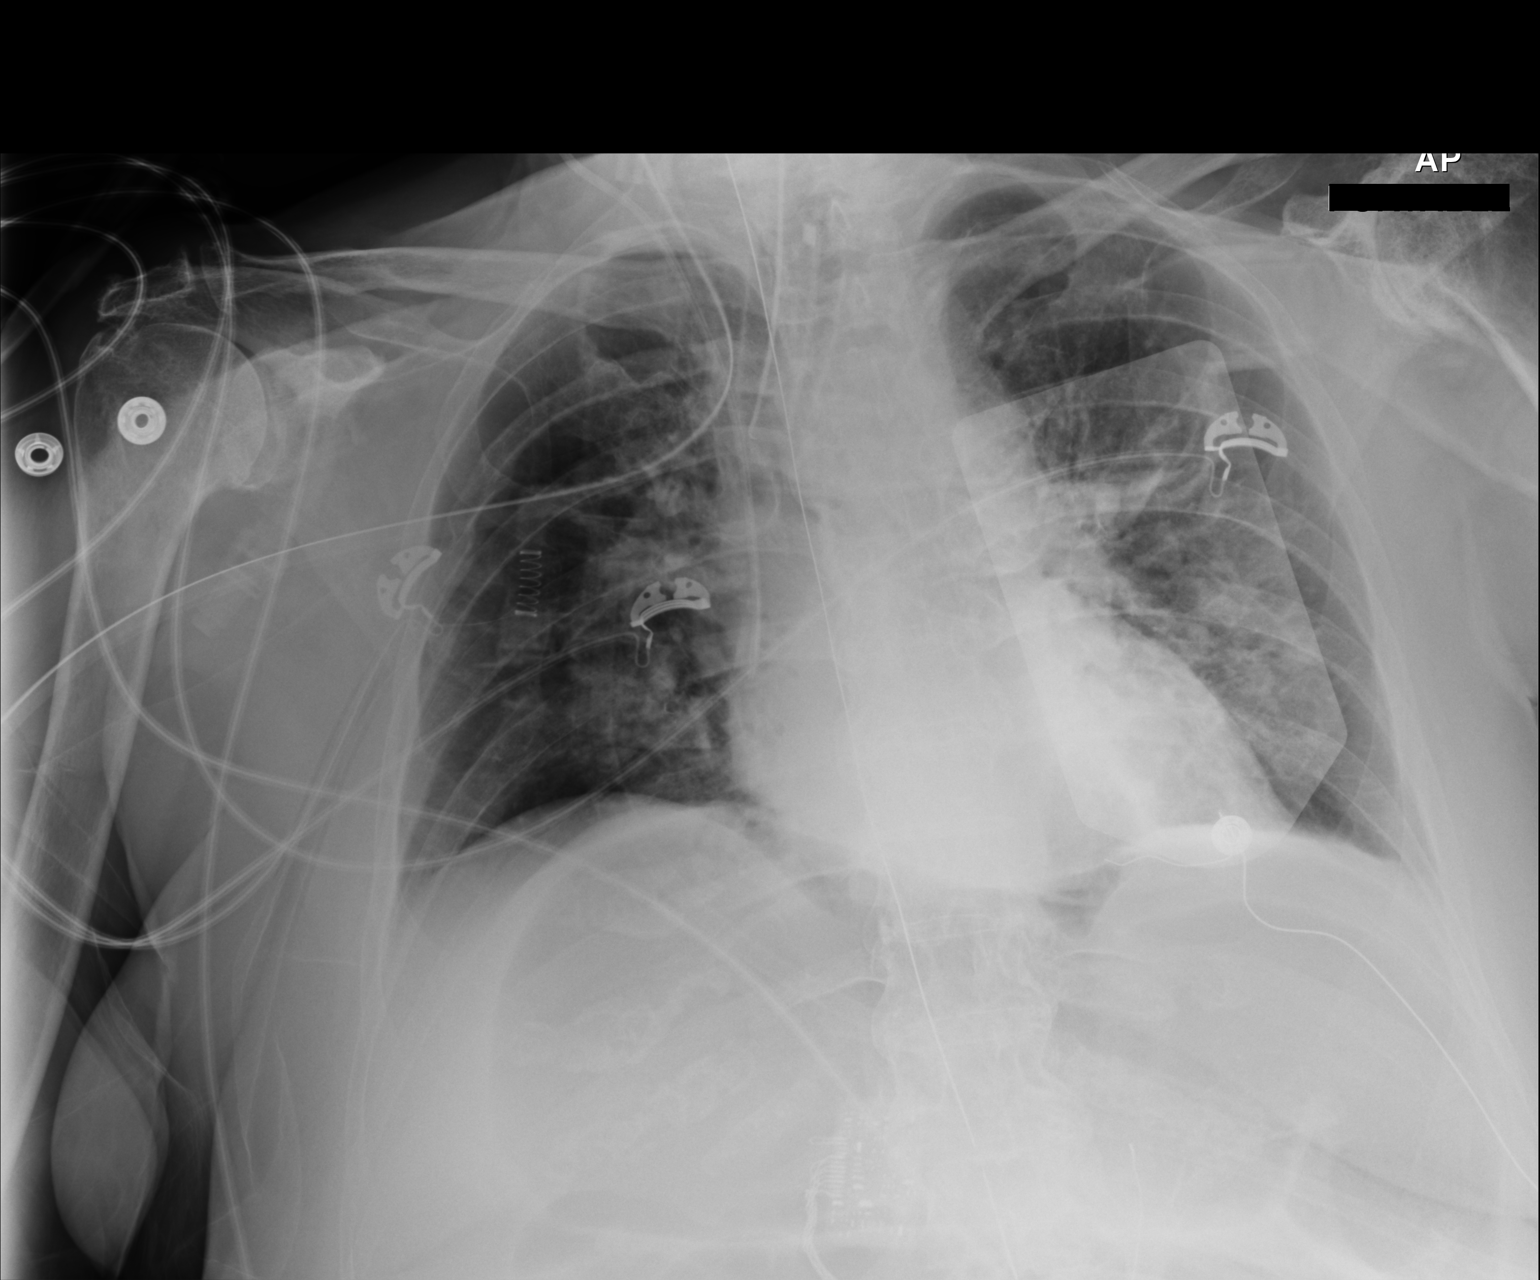

[1 of 1 positions shown; findings below may reference images not displayed]

FINDINGS: Endotracheal tube 2 cm above the carina.

Right jugular central venous catheter tip in the lower SVC. No
pneumothorax. NG tube in the stomach.

Left lower lobe airspace disease unchanged.  No effusion or edema.

Large amount of gas below the diaphragm is unchanged from earlier
today and yesterday. No free air was identified on decubitus view of
the abdomen yesterday.
IMPRESSION: Central line in the lower SVC without pneumothorax. Endotracheal
tube remains in good position

Left lower lobe airspace disease unchanged, possible pneumonia

Marked gaseous distention below the diaphragm presumably dilated
colon unchanged from earlier today and yesterday.
